# Patient Record
Sex: Male | Born: 1940 | Race: White | Hispanic: No | Smoking: Former smoker
Health system: Southern US, Community
[De-identification: ages and names within clinical notes are randomized; demographics above are authoritative.]

## PROBLEM LIST (undated history)

## (undated) DIAGNOSIS — M1990 Unspecified osteoarthritis, unspecified site: Secondary | ICD-10-CM

## (undated) DIAGNOSIS — I10 Essential (primary) hypertension: Secondary | ICD-10-CM

## (undated) DIAGNOSIS — E785 Hyperlipidemia, unspecified: Secondary | ICD-10-CM

## (undated) HISTORY — PX: EYE SURGERY: SHX253

## (undated) HISTORY — DX: Hyperlipidemia, unspecified: E78.5

---

## 2003-07-20 HISTORY — PX: COLONOSCOPY: SHX174

## 2004-03-26 ENCOUNTER — Encounter: Admission: RE | Admit: 2004-03-26 | Discharge: 2004-03-26 | Payer: Self-pay | Admitting: Family Medicine

## 2012-08-15 ENCOUNTER — Encounter: Payer: Self-pay | Admitting: Gastroenterology

## 2013-03-14 ENCOUNTER — Other Ambulatory Visit: Payer: Self-pay | Admitting: Internal Medicine

## 2013-03-14 DIAGNOSIS — K769 Liver disease, unspecified: Secondary | ICD-10-CM

## 2013-03-21 ENCOUNTER — Other Ambulatory Visit: Payer: Self-pay | Admitting: Internal Medicine

## 2013-03-21 DIAGNOSIS — K769 Liver disease, unspecified: Secondary | ICD-10-CM

## 2013-03-22 ENCOUNTER — Other Ambulatory Visit: Payer: Self-pay

## 2013-03-22 ENCOUNTER — Ambulatory Visit
Admission: RE | Admit: 2013-03-22 | Discharge: 2013-03-22 | Disposition: A | Discharge status: Home / Self Care | Payer: Medicare Other | Source: Ambulatory Visit | Attending: Internal Medicine | Admitting: Internal Medicine

## 2013-03-22 DIAGNOSIS — K769 Liver disease, unspecified: Secondary | ICD-10-CM

## 2013-03-26 ENCOUNTER — Ambulatory Visit
Admission: RE | Admit: 2013-03-26 | Discharge: 2013-03-26 | Disposition: A | Discharge status: Home / Self Care | Payer: Medicare Other | Source: Ambulatory Visit | Attending: Internal Medicine | Admitting: Internal Medicine

## 2013-03-26 DIAGNOSIS — K769 Liver disease, unspecified: Secondary | ICD-10-CM

## 2013-03-26 MED ORDER — GADOBENATE DIMEGLUMINE 529 MG/ML IV SOLN
15.0000 mL | Freq: Once | INTRAVENOUS | Status: AC | PRN
  Administered 2013-03-26: 15 mL via INTRAVENOUS

## 2013-03-27 ENCOUNTER — Other Ambulatory Visit: Payer: Medicare Other

## 2013-05-16 ENCOUNTER — Encounter: Payer: Self-pay | Admitting: Gastroenterology

## 2013-06-13 ENCOUNTER — Encounter: Payer: Medicare Other | Admitting: Gastroenterology

## 2013-08-15 ENCOUNTER — Encounter: Payer: Self-pay | Admitting: Gastroenterology

## 2013-11-21 ENCOUNTER — Ambulatory Visit (AMBULATORY_SURGERY_CENTER): Payer: Medicare Other | Admitting: *Deleted

## 2013-11-21 VITALS — Wt 169.2 lb

## 2013-11-21 DIAGNOSIS — Z1211 Encounter for screening for malignant neoplasm of colon: Secondary | ICD-10-CM

## 2013-11-21 MED ORDER — MOVIPREP 100 G PO SOLR: ORAL | Status: DC

## 2013-11-21 NOTE — Progress Notes (Signed)
Patient denies any allergies to eggs or soy. Patient denies any problems with anesthesia/sedation. Patient denies any oxygen use at home and does not take any diet/weight loss medications. EMMI education assisgned to patient on colonoscopy. 

## 2013-11-26 ENCOUNTER — Encounter: Payer: Self-pay | Admitting: Internal Medicine

## 2013-12-05 ENCOUNTER — Ambulatory Visit (AMBULATORY_SURGERY_CENTER): Payer: Medicare Other | Admitting: Internal Medicine

## 2013-12-05 ENCOUNTER — Encounter: Payer: Self-pay | Admitting: Internal Medicine

## 2013-12-05 VITALS — BP 129/74 | HR 65 | Temp 96.3°F | Resp 14 | Ht 71.0 in | Wt 169.0 lb

## 2013-12-05 DIAGNOSIS — Z1211 Encounter for screening for malignant neoplasm of colon: Secondary | ICD-10-CM

## 2013-12-05 MED ORDER — SODIUM CHLORIDE 0.9 % IV SOLN: 500.0000 mL | INTRAVENOUS | Status: DC

## 2013-12-05 NOTE — Patient Instructions (Signed)
Discharge instructions given with verbal understanding. Handouts on diverticulosis and a high fiber diet. Resume previous medications. YOU HAD AN ENDOSCOPIC PROCEDURE TODAY AT THE [facility]: Refer to the procedure report that was given to you for any specific questions about what was found during the examination.  If the procedure report does not answer your questions, please call your gastroenterologist to clarify.  If you requested that your care partner not be given the details of your procedure findings, then the procedure report has been included in a sealed envelope for you to review at your convenience later.  YOU SHOULD EXPECT: Some feelings of bloating in the abdomen. Passage of more gas than usual.  Walking can help get rid of the air that was put into your GI tract during the procedure and reduce the bloating. If you had a lower endoscopy (such as a colonoscopy or flexible sigmoidoscopy) you may notice spotting of blood in your stool or on the toilet paper. If you underwent a bowel prep for your procedure, then you may not have a normal bowel movement for a few days.  DIET: Your first meal following the procedure should be a light meal and then it is ok to progress to your normal diet.  A half-sandwich or bowl of soup is an example of a good first meal.  Heavy or fried foods are harder to digest and may make you feel nauseous or bloated.  Likewise meals heavy in dairy and vegetables can cause extra gas to form and this can also increase the bloating.  Drink plenty of fluids but you should avoid alcoholic beverages for 24 hours.  ACTIVITY: Your care partner should take you home directly after the procedure.  You should plan to take it easy, moving slowly for the rest of the day.  You can resume normal activity the day after the procedure however you should NOT DRIVE or use heavy machinery for 24 hours (because of the sedation medicines used during the test).    SYMPTOMS TO REPORT  IMMEDIATELY: A gastroenterologist can be reached at any hour.  During normal business hours, 8:30 AM to 5:00 PM Monday through Friday, call [phone].  After hours and on weekends, please call the GI answering service at [phone] who will take a message and have the physician on call contact you.   Following lower endoscopy (colonoscopy or flexible sigmoidoscopy):  Excessive amounts of blood in the stool  Significant tenderness or worsening of abdominal pains  Swelling of the abdomen that is new, acute  Fever of 100F or higher FOLLOW UP: If any biopsies were taken you will be contacted by phone or by letter within the next 1-3 weeks.  Call your gastroenterologist if you have not heard about the biopsies in 3 weeks.  Our staff will call the home number listed on your records the next business day following your procedure to check on you and address any questions or concerns that you may have at that time regarding the information given to you following your procedure. This is a courtesy call and so if there is no answer at the home number and we have not heard from you through the emergency physician on call, we will assume that you have returned to your regular daily activities without incident.  SIGNATURES/CONFIDENTIALITY: You and/or your care partner have signed paperwork which will be entered into your electronic medical record.  These signatures attest to the fact that that the information above on your After   Visit Summary has been reviewed and is understood.  Full responsibility of the confidentiality of this discharge information lies with you and/or your care-partner. 

## 2013-12-05 NOTE — Op Note (Signed)
[facility] [address] [place], [id number]   COLONOSCOPY PROCEDURE REPORT  PATIENT: Manuel [name], Manuel L.  MR#: [id number] BIRTHDATE: [birth year] , 72  yrs. old GENDER: Male ENDOSCOPIST: Hart Carwinora M Sobia Karger, MD REFERRED XB:JYNWGNBY:Daniel Eloise HarmanPaterson, M.D. PROCEDURE DATE:  12/05/2013 PROCEDURE:   Colonoscopy, screening First Screening Colonoscopy - Avg.  risk and is 50 yrs.  old or older - No.  Prior Negative Screening - Now for repeat screening. 10 or more years since last screening  History of Adenoma - Now for follow-up colonoscopy & has been > or = to 3 yrs.  N/A  Polyps Removed Today? No.  Recommend repeat exam, <10 yrs? No. ASA CLASS:   Class II INDICATIONS:Average risk patient for colon cancer and prior colonoscopy March 2004 was a normal exam.  Flexible sigmoidoscopy in 1997 was normal. MEDICATIONS: MAC sedation, administered by CRNA and Propofol (Diprivan) 220 mg IV  DESCRIPTION OF PROCEDURE:   After the risks benefits and alternatives of the procedure were thoroughly explained, informed consent was obtained.  A digital rectal exam revealed no abnormalities of the rectum.   The LB [id number]  endoscope was introduced through the anus and advanced to the cecum, which was identified by both the appendix and ileocecal valve. No adverse events experienced.   The quality of the prep was excellent, using MoviPrep  The instrument was then slowly withdrawn as the colon was fully examined.      COLON FINDINGS: Mild diverticulosis was noted in the sigmoid colon. Retroflexed views revealed no abnormalities. The time to cecum=4 minutes 57 seconds.  Withdrawal time=6 minutes 55 seconds.  The scope was withdrawn and the procedure completed. COMPLICATIONS: There were no complications.  ENDOSCOPIC IMPRESSION: Mild diverticulosis was noted in the sigmoid colon  RECOMMENDATIONS: high fiber diet No recall due to age   eSigned:  Hart Carwinora M Devaeh Amadi, MD 12/05/2013 8:29  AM   cc:   PATIENT NAME:  Manuel [name], Manuel L. MR#: [id number]

## 2013-12-06 ENCOUNTER — Telehealth: Payer: Self-pay | Admitting: *Deleted

## 2013-12-06 NOTE — Telephone Encounter (Signed)
  Follow up Call-  Call back number 12/05/2013  Post procedure Call Back phone  # [phone]  Permission to leave phone message Yes     Patient questions:  Do you have a fever, pain , or abdominal swelling? no Pain Score  0 *  Have you tolerated food without any problems? yes  Have you been able to return to your normal activities? yes  Do you have any questions about your discharge instructions: Diet   no Medications  no Follow up visit  no  Do you have questions or concerns about your Care? no  Actions: * If pain score is 4 or above: No action needed, pain <4.

## 2014-03-11 ENCOUNTER — Encounter: Payer: Self-pay | Admitting: Podiatry

## 2014-03-11 ENCOUNTER — Ambulatory Visit (INDEPENDENT_AMBULATORY_CARE_PROVIDER_SITE_OTHER): Payer: Medicare Other | Admitting: Podiatry

## 2014-03-11 ENCOUNTER — Ambulatory Visit (INDEPENDENT_AMBULATORY_CARE_PROVIDER_SITE_OTHER): Payer: Medicare Other

## 2014-03-11 VITALS — BP 151/80 | HR 71 | Resp 14 | Ht 71.0 in | Wt 160.0 lb

## 2014-03-11 DIAGNOSIS — M722 Plantar fascial fibromatosis: Secondary | ICD-10-CM

## 2014-03-11 DIAGNOSIS — M2020 Hallux rigidus, unspecified foot: Secondary | ICD-10-CM

## 2014-03-11 MED ORDER — TRIAMCINOLONE ACETONIDE 10 MG/ML IJ SUSP: 10.0000 mg | Freq: Once | INTRAMUSCULAR | Status: DC

## 2014-03-11 NOTE — Patient Instructions (Signed)

## 2014-03-11 NOTE — Progress Notes (Signed)
   Subjective:    Patient ID: Manuel [name], male    DOB: 05-31-1941, 73 y.o.   MRN: [id number]  HPI Comments: Pt complains of left medial and lateral heel pain for one month.  Pt states he has used stretching and ice therapy.     Review of Systems  Musculoskeletal: Positive for arthralgias.  All other systems reviewed and are negative.      Objective:   Physical Exam        Assessment & Plan:

## 2014-03-12 ENCOUNTER — Telehealth: Payer: Self-pay | Admitting: *Deleted

## 2014-03-12 NOTE — Telephone Encounter (Signed)
I returned his call and informed him it is okay to do the stretching exercises and the ice.  He said that sounds good, thanks for calling.

## 2014-03-12 NOTE — Progress Notes (Signed)
Subjective:     Patient ID: Manuel [name], male   DOB: [birth year], 73 y.o.   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 Injected the plantar fascial left 3 mg Kenalog 5 mg Xylocaine and discussed open surgery for the big toe joint if symptoms were to persist

## 2014-03-12 NOTE — Telephone Encounter (Signed)
I was in yesterday for Plantar Fasciitis.  He gave me a shot and a brace.  I was just wonder should I continue stretching and ice?  Please give me a call.  I'm scheduled to see him on Monday.

## 2014-03-18 ENCOUNTER — Encounter: Payer: Self-pay | Admitting: Podiatry

## 2014-03-18 ENCOUNTER — Ambulatory Visit (INDEPENDENT_AMBULATORY_CARE_PROVIDER_SITE_OTHER): Payer: Medicare Other | Admitting: Podiatry

## 2014-03-18 DIAGNOSIS — M722 Plantar fascial fibromatosis: Secondary | ICD-10-CM

## 2014-03-18 MED ORDER — TRIAMCINOLONE ACETONIDE 10 MG/ML IJ SUSP: 10.0000 mg | Freq: Once | INTRAMUSCULAR | Status: DC

## 2014-03-19 NOTE — Progress Notes (Signed)
Subjective:     Patient ID: Manuel [name], male   DOB: [birth year], 73 y.o.   MRN: [id number]  HPI patient states doing better but one area that still gets moderately discomforting   Review of Systems     Objective:   Physical Exam Neurovascular status intact with pain plantar aspect left that still is present upon deep palpation    Assessment:     Plantar fasciitis left still present    Plan:     Reinjected the plantar fascia 3 mg Kenalog 5 mg Xylocaine and advised on reduced activity for the next week

## 2014-05-20 ENCOUNTER — Other Ambulatory Visit: Payer: Self-pay | Admitting: Internal Medicine

## 2014-05-20 DIAGNOSIS — R160 Hepatomegaly, not elsewhere classified: Secondary | ICD-10-CM

## 2014-05-26 ENCOUNTER — Ambulatory Visit
Admission: RE | Admit: 2014-05-26 | Discharge: 2014-05-26 | Disposition: A | Discharge status: Home / Self Care | Payer: Medicare Other | Source: Ambulatory Visit | Attending: Internal Medicine | Admitting: Internal Medicine

## 2014-05-26 DIAGNOSIS — R160 Hepatomegaly, not elsewhere classified: Secondary | ICD-10-CM

## 2014-05-26 MED ORDER — GADOBENATE DIMEGLUMINE 529 MG/ML IV SOLN
15.0000 mL | Freq: Once | INTRAVENOUS | Status: AC | PRN
  Administered 2014-05-26: 15 mL via INTRAVENOUS

## 2014-06-04 ENCOUNTER — Other Ambulatory Visit: Payer: Self-pay | Admitting: Internal Medicine

## 2014-06-04 DIAGNOSIS — R51 Headache: Principal | ICD-10-CM

## 2014-06-04 DIAGNOSIS — R519 Headache, unspecified: Secondary | ICD-10-CM

## 2014-06-10 ENCOUNTER — Ambulatory Visit
Admission: RE | Admit: 2014-06-10 | Discharge: 2014-06-10 | Disposition: A | Discharge status: Home / Self Care | Payer: Medicare Other | Source: Ambulatory Visit | Attending: Internal Medicine | Admitting: Internal Medicine

## 2014-06-10 DIAGNOSIS — R519 Headache, unspecified: Secondary | ICD-10-CM

## 2014-06-10 DIAGNOSIS — R51 Headache: Principal | ICD-10-CM

## 2016-03-19 IMAGING — MR MR ABDOMEN WO/W CM
11 of 16 series · 26 of 48 positions shown · IV contrast (multihance)
Comparison: MRI abdomen dated 03/26/2013. CT abdomen pelvis dated
03/07/2013.

CLINICAL DATA: Follow up liver lesion on MRI

EXAM:
MRI ABDOMEN WITHOUT AND WITH CONTRAST
TECHNIQUE: Multiplanar multisequence MR imaging of the abdomen was performed
both before and after the administration of intravenous contrast.
CONTRAST:  15mL MULTIHANCE GADOBENATE DIMEGLUMINE 529 MG/ML IV SOLN

[Series 3: cor haste · coronal · 5.0mm · 0.74mm/px · 1 of 32 slices shown]
[im 1/32]
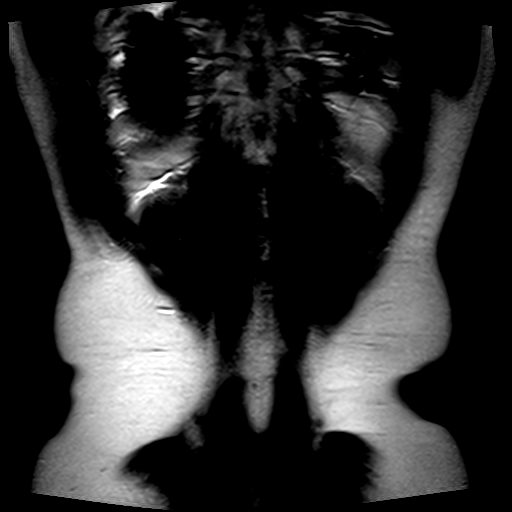

[Series 4: T1 · axial · 6.0mm · 0.74mm/px · z∈[-100,+118]mm · 2 of 68 slices shown]
[im 1/68]
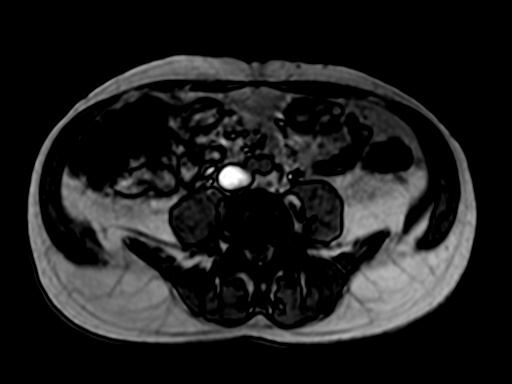
[im 68/68]
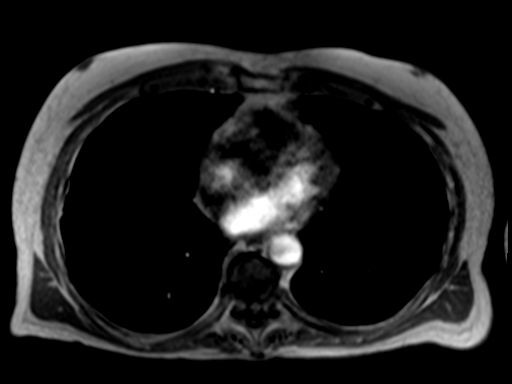

[Series 5: axial haste · axial · 6.0mm · 0.78mm/px · 1 of 34 slices shown]
[im 1/34]
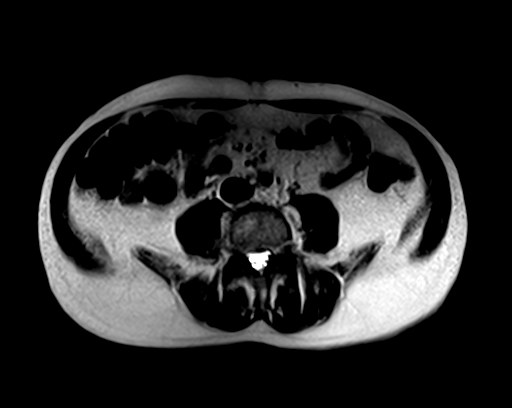

[Series 6: T2 · axial · 6.0mm · 1.04mm/px · 1 of 30 slices shown]
[im 1/30]
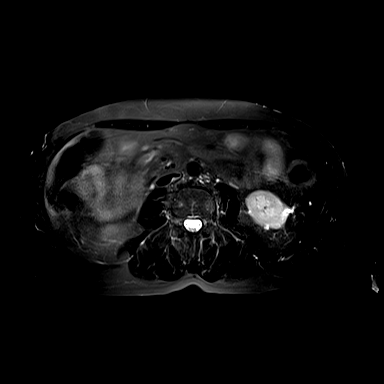

[Series 7: ep2d_diff_b50_500_800_p2_trig · axial · 6.0mm · 2.08mm/px · z∈[-58,+150]mm · 3 of 90 slices shown]
[im 1/90]
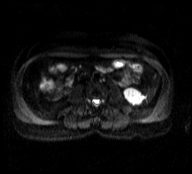
[im 45/90]
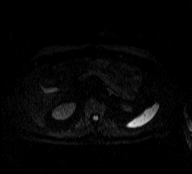
[im 90/90]
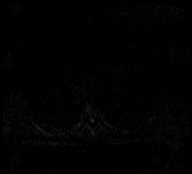

[Series 8: ep2d_diff_b50_500_800_p2_trig_adc · axial · 6.0mm · 2.08mm/px · 1 of 30 slices shown]
[im 1/30]
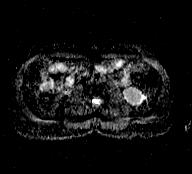

[Series 9: T1 dynamic · axial · non-contrast · 2.5mm · 0.78mm/px · z∈[-102,+116]mm · 4 of 88 slices shown]
[im 1/88]
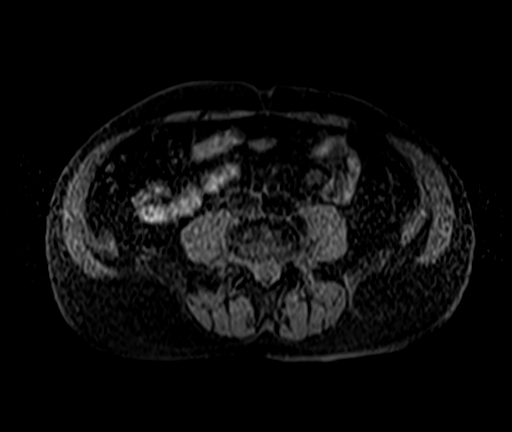
[im 30/88]
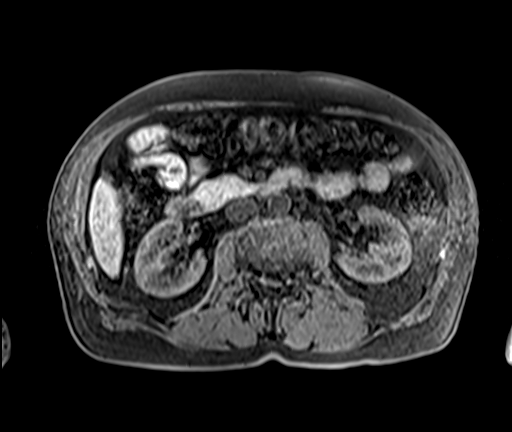
[im 59/88]
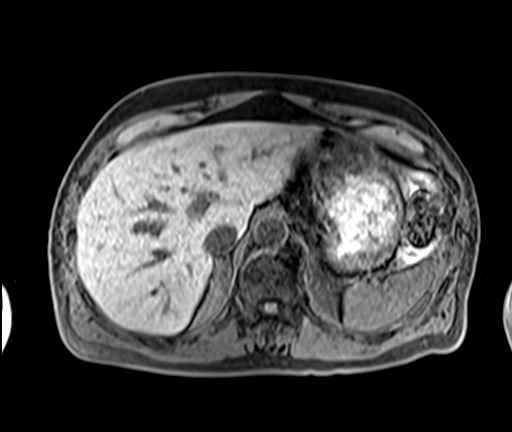
[im 88/88]
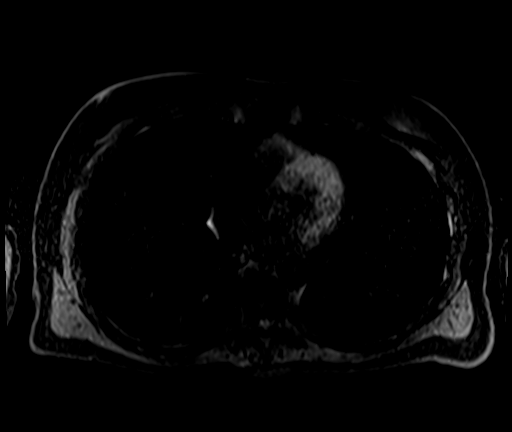

[Series 10: T1 dynamic post-contrast · axial · 2.5mm · 0.78mm/px · z∈[-102,+116]mm · 4 of 88 slices shown (1 of 4)]
[im 1/88]
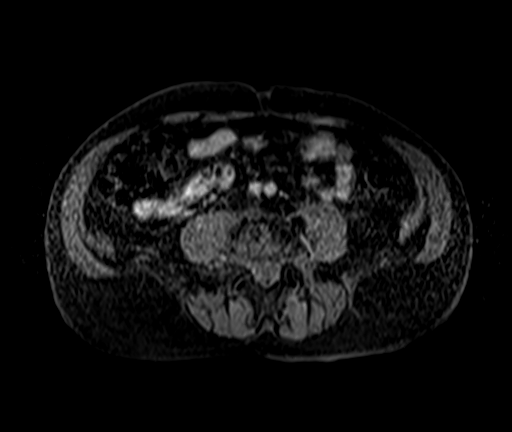
[im 30/88]
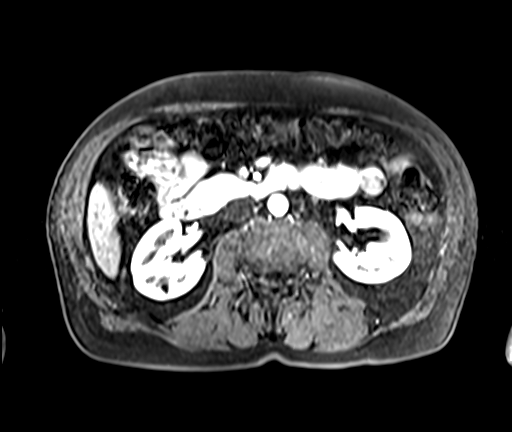
[im 59/88]
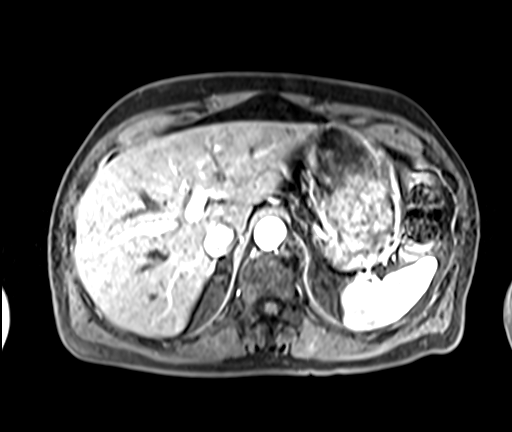
[im 88/88]
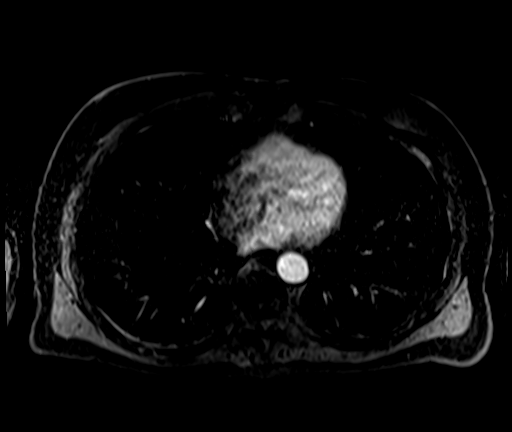

[Series 11: T1 dynamic post-contrast · axial · 2.5mm · 0.78mm/px · z∈[-102,+116]mm · 4 of 88 slices shown (2 of 4)]
[im 1/88]
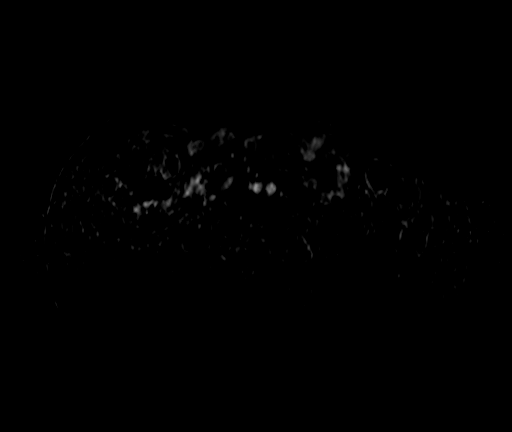
[im 30/88]
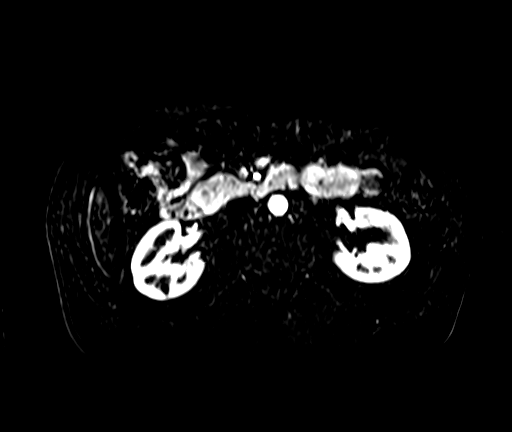
[im 59/88]
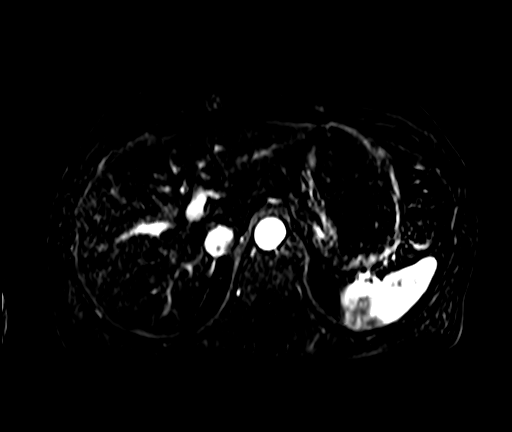
[im 88/88]
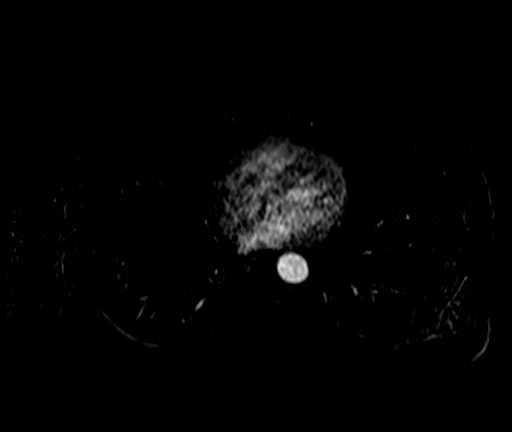

[Series 12: T1 dynamic post-contrast · axial · 2.5mm · 0.78mm/px · z∈[-102,+116]mm · 4 of 88 slices shown (3 of 4)]
[im 1/88]
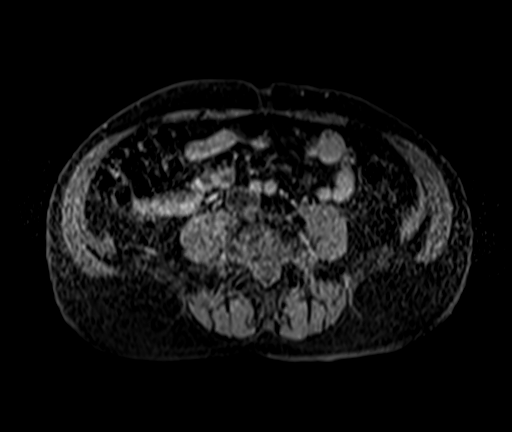
[im 30/88]
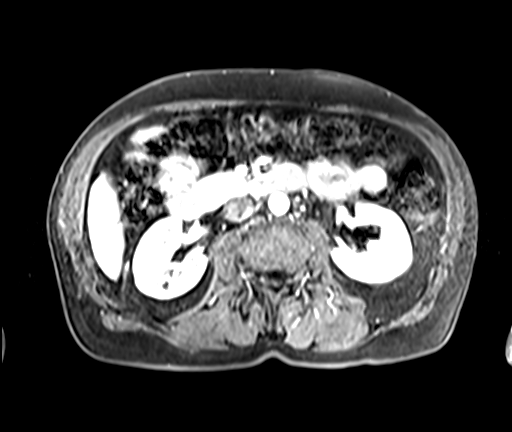
[im 59/88]
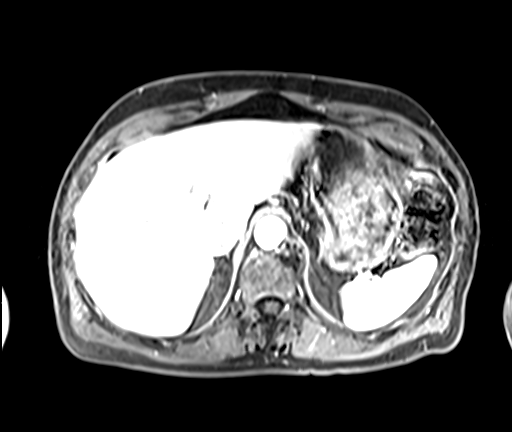
[im 88/88]
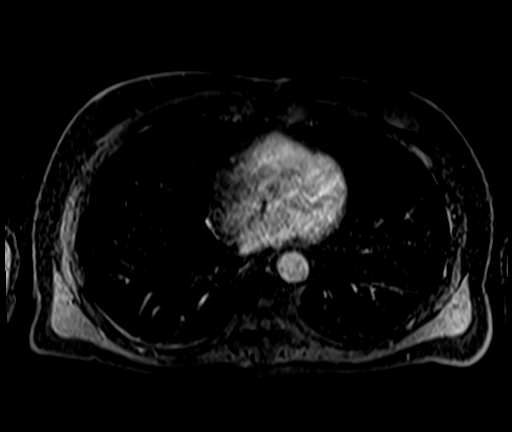

[Series 13: T1 dynamic post-contrast · axial · 2.5mm · 0.78mm/px · 1 of 88 slices shown (4 of 4)]
[im 1/88]
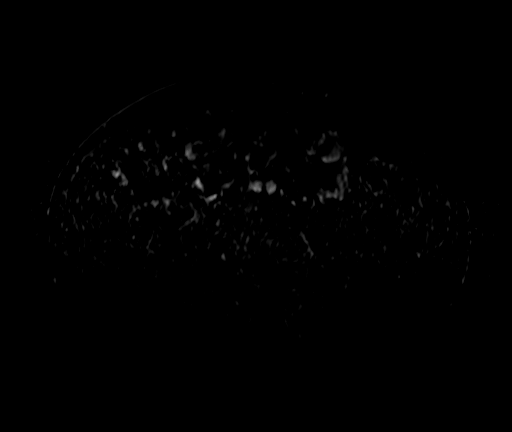

[26 of 48 positions shown; findings below may reference images not displayed]

FINDINGS: Prior irregular cyst along the anterior aspect of the caudate has
essentially resolved, with minimal residual scarring (series
17/image 39).

Additional scattered stable hepatic cysts measuring up to 6 mm
(series 5/image 8).

Gallbladder is unremarkable. No intrahepatic or extrahepatic ductal
dilatation. No suspicious/enhancing hepatic lesions.

Gallbladder is unremarkable. No intrahepatic or extrahepatic ductal
dilatation.

Pancreas is within normal limits.

Spleen and adrenal glands are unremarkable.

9 mm cyst in the posterior interpolar right kidney (series 6/ image
20). Left kidney is within normal limits. No hydronephrosis.

No abdominal ascites.

No suspicious abdominal lymphadenopathy.

No focal osseous lesions.
IMPRESSION: Prior irregular cyst along the anterior aspect of the caudate has
essentially resolved, with minimal residual scarring. No further
follow-up imaging is required.

## 2018-06-30 ENCOUNTER — Encounter: Payer: Self-pay | Admitting: Podiatry

## 2018-06-30 ENCOUNTER — Other Ambulatory Visit: Payer: Self-pay | Admitting: Podiatry

## 2018-06-30 ENCOUNTER — Ambulatory Visit (INDEPENDENT_AMBULATORY_CARE_PROVIDER_SITE_OTHER): Payer: Medicare Other

## 2018-06-30 ENCOUNTER — Ambulatory Visit: Payer: Medicare Other | Admitting: Podiatry

## 2018-06-30 VITALS — BP 179/100 | HR 77

## 2018-06-30 DIAGNOSIS — M79672 Pain in left foot: Secondary | ICD-10-CM

## 2018-06-30 DIAGNOSIS — M779 Enthesopathy, unspecified: Secondary | ICD-10-CM | POA: Diagnosis not present

## 2018-06-30 DIAGNOSIS — M79671 Pain in right foot: Secondary | ICD-10-CM

## 2018-06-30 DIAGNOSIS — M205X9 Other deformities of toe(s) (acquired), unspecified foot: Secondary | ICD-10-CM | POA: Diagnosis not present

## 2018-07-02 NOTE — Progress Notes (Signed)
Subjective:   Patient ID: Manuel [name], male   DOB: 77 y.o.   MRN: [id number]   HPI Patient presents stating he has had chronic pain in his feet and he just wants to get it checked and he feels like he has arthritis of his big toe joint bilateral.  Also has high blood pressure that he keeps monitored and it does tend to go up with a doctor and was up today.  Patient does not smoke and likes to be active   Review of Systems  All other systems reviewed and are negative.       Objective:  Physical Exam Vitals signs and nursing note reviewed.  Constitutional:      Appearance: He is well-developed.  Pulmonary:     Effort: Pulmonary effort is normal.  Musculoskeletal: Normal range of motion.  Skin:    General: Skin is warm.  Neurological:     Mental Status: He is alert.     Neurovascular status intact muscle strength is adequate range of motion was within normal limits except for severe loss of motion first MPJ bilateral with crepitus of the joint bone spur formation and pain developed of a mild nature with palpation.  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  If symptoms get worse we will need to consider surgery and ultimately may require surgery the big toe joint  X-rays indicate there is severe arthritis and bone spur formation around the big toe joint bilateral with moderate discomfort upon palpation

## 2018-07-05 DIAGNOSIS — H6123 Impacted cerumen, bilateral: Secondary | ICD-10-CM | POA: Insufficient documentation

## 2018-08-16 ENCOUNTER — Other Ambulatory Visit: Payer: Self-pay | Admitting: Podiatry

## 2018-08-16 ENCOUNTER — Ambulatory Visit (INDEPENDENT_AMBULATORY_CARE_PROVIDER_SITE_OTHER): Payer: Medicare Other | Admitting: Podiatry

## 2018-08-16 ENCOUNTER — Ambulatory Visit (INDEPENDENT_AMBULATORY_CARE_PROVIDER_SITE_OTHER): Payer: Medicare Other

## 2018-08-16 ENCOUNTER — Encounter: Payer: Self-pay | Admitting: Podiatry

## 2018-08-16 DIAGNOSIS — M779 Enthesopathy, unspecified: Secondary | ICD-10-CM

## 2018-08-16 DIAGNOSIS — M79671 Pain in right foot: Secondary | ICD-10-CM

## 2018-08-16 DIAGNOSIS — M205X9 Other deformities of toe(s) (acquired), unspecified foot: Secondary | ICD-10-CM | POA: Diagnosis not present

## 2018-08-16 DIAGNOSIS — M7751 Other enthesopathy of right foot: Secondary | ICD-10-CM | POA: Diagnosis not present

## 2018-08-16 MED ORDER — TRIAMCINOLONE ACETONIDE 10 MG/ML IJ SUSP
10.0000 mg | Freq: Once | INTRAMUSCULAR | Status: AC
  Administered 2018-08-16: 10 mg

## 2018-08-16 NOTE — Progress Notes (Signed)
Subjective:   Patient ID: Manuel [name], male   DOB: 78 y.o.   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  I explained procedure risk and patient wants the procedure and today I did a proximal nerve block aspirated the second MPJ getting out a small amount of a bloody infiltrate indicating probable trauma to the joint over the holidays and I did careful injection quarter cc Dexasone Kenalog and advised on rigid bottom shoes.  Discussed the hallux limitus again and patient will be seen back to recheck  X-rays indicated severe arthritis first MPJ right with no indications of damage for fracture second MPJ

## 2018-10-30 ENCOUNTER — Telehealth: Payer: Self-pay | Admitting: *Deleted

## 2018-10-30 NOTE — Telephone Encounter (Signed)
Pt emailed, "This is a message for Dr. Charlsie Merles.  You have previosly diagnosed me with arthritis in my feet. I still have pain in th ball of my feet when I walk on hard surfaces and in by ball and toes when I have walked 18 holes of golf. A friend said I should use Blue Emu. What is your opinion of the effectiveness of this and are there any significant side effects."

## 2018-11-01 NOTE — Telephone Encounter (Signed)
Worth giving a try. No downside

## 2018-11-01 NOTE — Telephone Encounter (Signed)
Left message informing informing pt of DR. Regal's orders and that watch for skin irritates that could occur with any topical and not to apply to broken skin and if no improvement make an appt.

## 2019-03-01 ENCOUNTER — Ambulatory Visit: Payer: Self-pay | Admitting: General Surgery

## 2019-03-01 NOTE — H&P (Signed)
Manuel [name]: 02/28/2019 10:29 AM Location: [facility] Patient #: [id number] DOB: 04/28/1941 Widowed / Language: Manuel [name] / Race: White Male  History of Present Illness Manuel [name] Manuel [name]; 03/01/2019 9:35 AM) The patient is a 78 year old male who presents with an inguinal hernia. He is referred by Dr. Sharlett [name] for evaluation of a right inguinal hernia. He states about a week or 2 he started noticing some pain and discomfort and a bulge in his right groin. He states the bulge comes and goes. It is not there in the morning but progressively becomes prominent throughout the day especially at the end of the day. He will tend to have discomfort later in the day. He describes it as a dull ache. He feels it more in the evening. He is quite active. He plays golf several times a week and walks up to 6 miles per day. However the hernia is causing some interference with his walking and coughing due to the discomfort. He does have some BPH issues. He denies any prior abdominal surgery. He denies any chest pain, chest pressure, source of breath, orthopnea, TIAs or amaurosis fugax. He is a remote smoker quit many years ago. No drug or significant alcohol use. No blood thinners. He does live alone   Problem List/Past Medical Manuel [name], [name]; 03/01/2019 9:36 AM) RIGHT INGUINAL HERNIA (K40.90) NOCTURIA ASSOCIATED WITH BENIGN PROSTATIC HYPERPLASIA (N40.1)  Past Surgical History Manuel [name], Manuel [name]; 02/28/2019 10:29 AM) Cataract Surgery Bilateral.  Diagnostic Studies History Manuel [name], Manuel [name]; 02/28/2019 10:29 AM) Colonoscopy 5-10 years ago  Allergies Manuel [name], Manuel [name]; 02/28/2019 10:30 AM) No Known Allergies [02/28/2019]: No Known Drug Allergies [02/28/2019]: Allergies Reconciled  Medication History Manuel [name], [name]; 03/01/2019 9:36 AM) Atorvastatin Calcium (10MG  Tablet, Oral) Active. amLODIPine Besylate (2.5MG  Tablet, Oral) Active. Aspir-Low (81MG   Tablet DR, Oral) Active. Medications Reconciled Tamsulosin HCl (0.4MG  Capsule, 1 (one) Oral daily, Taken starting 02/28/2019) Active.  Social History (Manuel [name], [name]; 02/28/2019 10:29 AM) Alcohol use Occasional alcohol use. Caffeine use Coffee. No drug use Tobacco use Former smoker.  Family History Manuel [name], Manuel [name]; 02/28/2019 10:29 AM) Arthritis Mother. Colon Cancer Mother. Hypertension Father, Mother. Thyroid problems Mother.  Other Problems Manuel [name], [name]; 03/01/2019 9:36 AM) Arthritis High blood pressure Hypercholesterolemia     Review of Systems (Manuel [name] Manuel [name]; 02/28/2019 10:29 AM) General Not Present- Appetite Loss, Chills, Fatigue, Fever, Night Sweats, Weight Gain and Weight Loss. Skin Not Present- Change in Wart/Mole, Dryness, Hives, Jaundice, New Lesions, Non-Healing Wounds, Rash and Ulcer. HEENT Not Present- Earache, Hearing Loss, Hoarseness, Nose Bleed, Oral Ulcers, Ringing in the Ears, Seasonal Allergies, Sinus Pain, Sore Throat, Visual Disturbances, Wears glasses/contact lenses and Yellow Eyes. Respiratory Not Present- Bloody sputum, Chronic Cough, Difficulty Breathing, Snoring and Wheezing. Breast Not Present- Breast Mass, Breast Pain, Nipple Discharge and Skin Changes. Cardiovascular Not Present- Chest Pain, Difficulty Breathing Lying Down, Leg Cramps, Palpitations, Rapid Heart Rate, Shortness of Breath and Swelling of Extremities. Gastrointestinal Present- Abdominal Pain. Not Present- Bloating, Bloody Stool, Change in Bowel Habits, Chronic diarrhea, Constipation, Difficulty Swallowing, Excessive gas, Gets full quickly at meals, Hemorrhoids, Indigestion, Nausea, Rectal Pain and Vomiting. Male Genitourinary Not Present- Blood in Urine, Change in Urinary Stream, Frequency, Impotence, Nocturia, Painful Urination, Urgency and Urine Leakage. Musculoskeletal Not Present- Back Pain, Joint Pain, Joint Stiffness, Muscle Pain, Muscle Weakness and  Swelling of Extremities. Neurological Not Present- Decreased Memory, Fainting, Headaches, Numbness, Seizures, Tingling, Tremor, Trouble walking and Weakness. Psychiatric Not Present- Anxiety, Bipolar,  Change in Sleep Pattern, Depression, Fearful and Frequent crying. Endocrine Not Present- Cold Intolerance, Excessive Hunger, Hair Changes, Heat Intolerance, Hot flashes and New Diabetes. Hematology Not Present- Blood Thinners, Easy Bruising, Excessive bleeding, Gland problems, HIV and Persistent Infections.  Vitals (Manuel [name] Manuel [name]; 02/28/2019 10:31 AM) 02/28/2019 10:30 AM Weight: 175.2 lb Height: 70in Body Surface Area: 1.97 m Body Mass Index: 25.14 kg/m  Temp.: 97.70F(Temporal)  Pulse: 98 (Regular)  P.OX: 99% (Room air) BP: 148/86 (Sitting, Left Arm, Standard)        Physical Exam Manuel Areola(Manuel [name] M. Manuel [name] [name]; 03/01/2019 9:35 AM)  General Mental Status-Alert. General Appearance-Consistent with stated age. Hydration-Well hydrated. Voice-Normal.  Head and Neck Head-normocephalic, atraumatic with no lesions or palpable masses. Trachea-midline. Thyroid Gland Characteristics - normal size and consistency.  Eye Eyeball - Bilateral-Extraocular movements intact. Sclera/Conjunctiva - Bilateral-No scleral icterus.  ENMT Ears Pinna - Bilateral - no bony growth in lateral aspect of ear canal, no drainage observed. Nose and Sinuses External Inspection of the Nose - symmetric, no deformities observed.  Chest and Lung Exam Chest and lung exam reveals -quiet, even and easy respiratory effort with no use of accessory muscles and on auscultation, normal breath sounds, no adventitious sounds and normal vocal resonance. Inspection Chest Wall - Normal. Back - normal.  Breast - Did not examine.  Cardiovascular Cardiovascular examination reveals -normal heart sounds, regular rate and rhythm with no murmurs and normal pedal pulses  bilaterally.  Abdomen Inspection Inspection of the abdomen reveals - No Hernias. Skin - Scar - no surgical scars. Palpation/Percussion Palpation and Percussion of the abdomen reveal - Soft, Non Tender, No Rebound tenderness, No Rigidity (guarding) and No hepatosplenomegaly. Auscultation Auscultation of the abdomen reveals - Bowel sounds normal.  Male Genitourinary Note: Obvious bulge in right groin. Reducible. Mild tenderness with deep palpation. Both testicles descended. No bulge with Valsalva in left groin  Peripheral Vascular Upper Extremity Palpation - Pulses bilaterally normal.  Neurologic Neurologic evaluation reveals -alert and oriented x 3 with no impairment of recent or remote memory. Mental Status-Normal.  Neuropsychiatric The patient's mood and affect are described as -normal. Judgment and Insight-insight is appropriate concerning matters relevant to self.  Musculoskeletal Normal Exam - Left-Upper Extremity Strength Normal and Lower Extremity Strength Normal. Normal Exam - Right-Upper Extremity Strength Normal and Lower Extremity Strength Normal.  Lymphatic Head & Neck  General Head & Neck Lymphatics: Bilateral - Description - Normal. Axillary - Did not examine. Femoral & Inguinal - Did not examine.    Assessment & Plan Manuel Areola(Kamylah Manzo M. Ajene Carchi [name]; 03/01/2019 9:36 AM)  RIGHT INGUINAL HERNIA (K40.90) Impression: We discussed the etiology of inguinal hernias. We discussed the signs & symptoms of incarceration & strangulation. We discussed non-operative and operative management.  The patient has elected to proceed with laparoscopic repair of right inguinal hernia with mesh   I described the procedure in detail. The patient was given educational material. We discussed the risks and benefits including but not limited to bleeding, infection, chronic inguinal pain, nerve entrapment, hernia recurrence, mesh complications, hematoma formation, urinary retention,  injury to the testicle, numbness in the groin, blood clots, injury to the surrounding structures, and anesthesia risk. We also discussed the typical post operative recovery course, including no heavy lifting for 4-6 weeks. I explained that the likelihood of improvement of their symptoms is good  Current Plans You are being scheduled for surgery- Our schedulers will call you.  You should hear from our office's scheduling department within 5 working days about the  location, date, and time of surgery. We try to make accommodations for patient's preferences in scheduling surgery, but sometimes the OR schedule or the surgeon's schedule prevents us from making those accommodations.  If you have not heard from our office [phone]) in 5 working days, call the office and ask for your surgeon's nurse.  If you have other questions about your diagnosis, plan, or surgery, call the office and ask for your surgeon's nurse.  Pt Education - Pamphlet Given - Laparoscopic Hernia Repair: discussed with patient and provided information. Started Tamsulosin HCl 0.4 MG Oral Capsule, 1 (one) Capsule daily, #30, 02/28/2019, No Refill.  NOCTURIA ASSOCIATED WITH BENIGN PROSTATIC HYPERPLASIA (N40.1) Impression: Since he has some BPH issues I recommended placing him on perioperative Flomax to try to decrease his risk of postoperative urinary retention  Mary SellaEric M. Andrey CampanileWilson, [name], FACS General, Bariatric, & Minimally Invasive Surgery [facility], [place]

## 2019-03-27 NOTE — Patient Instructions (Addendum)
DUE TO COVID-19 ONLY ONE VISITOR IS ALLOWED TO COME WITH YOU AND STAY IN THE WAITING ROOM ONLY DURING PRE OP AND PROCEDURE DAY OF SURGERY.  THE 1 VISITOR MAY VISIT WITH YOU AFTER SURGERY IN YOUR PRIVATE ROOM DURING VISITING HOURS ONLY!  YOU NEED TO HAVE A COVID 19 TEST ON__Fri 9/11_____ @__10 :45_____, THIS TEST MUST BE DONE BEFORE SURGERY, COME  [address], [place] , [address].  ([place])  ONCE YOUR COVID TEST IS COMPLETED, PLEASE BEGIN THE QUARANTINE INSTRUCTIONS AS OUTLINED IN YOUR HANDOUT.                Manuel [name]   Your procedure is scheduled on: Tuesday 04/03/19   Report to [facility] Main  Entrance   Report to admitting at  10:11 AM     Call this number if you have problems the morning of surgery [phone]    Remember: Do not eat food or drink liquids :After Midnight.  BRUSH YOUR TEETH MORNING OF SURGERY AND RINSE YOUR MOUTH OUT, NO CHEWING GUM CANDY OR MINTS.     Take these medicines the morning of surgery with A SIP OF WATER: Amlodipine, Tamsulosin                                 You may not have any metal on your body including piercings             Do not wear jewelry, lotions, powders or  deodorant                 Men may shave face and neck.   Do not bring valuables to the hospital. [place].  Contacts, dentures or bridgework may not be worn into surgery.       Patients discharged the day of surgery will not be allowed to drive home.  IF YOU ARE HAVING SURGERY AND GOING HOME THE SAME DAY, YOU MUST HAVE AN ADULT TO DRIVE YOU HOME AND BE WITH YOU FOR 24 HOURS.  YOU MAY GO HOME BY TAXI OR UBER OR ORTHERWISE, BUT AN ADULT MUST ACCOMPANY YOU HOME AND STAY WITH YOU FOR 24 HOURS.  Name and phone number of your driver:  Special Instructions: N/A              Please read over the following fact sheets you were  given: _____________________________________________________________________             [facility] - Preparing for Surgery Before surgery, you can play an important role .  Because skin is not sterile, your skin needs to be as free of germs as possible.   You can reduce the number of germs on your skin by washing with CHG (chlorahexidine gluconate) soap before surgery.   CHG is an antiseptic cleaner which kills germs and bonds with the skin to continue killing germs even after washing. Please DO NOT use if you have an allergy to CHG or antibacterial soaps.   If your skin becomes reddened/irritated stop using the CHG and inform your nurse when you arrive at Short Stay. .  You may shave your face/neck.  Please follow these instructions carefully:  1.  Shower with CHG Soap the night before surgery and the  morning of Surgery.  2.  If you choose  to wash your hair, wash your hair first as usual with your  normal  shampoo.  3.  After you shampoo, rinse your hair and body thoroughly to remove the  shampoo.                                        4.  Use CHG as you would any other liquid soap.  You can apply chg directly  to the skin and wash                       Gently with a scrungie or clean washcloth.  5.  Apply the CHG Soap to your body ONLY FROM THE NECK DOWN.   Do not use on face/ open                           Wound or open sores. Avoid contact with eyes, ears mouth and genitals (private parts).                       Wash face,  Genitals (private parts) with your normal soap.             6.  Wash thoroughly, paying special attention to the area where your surgery  will be performed.  7.  Thoroughly rinse your body with warm water from the neck down.  8.  DO NOT shower/wash with your normal soap after using and rinsing off  the CHG Soap.                9.  Pat yourself dry with a clean towel.            10.  Wear clean pajamas.            11.  Place clean sheets on your bed the night of  your first shower and do not  sleep with pets. Day of Surgery : Do not apply any lotions/deodorants the morning of surgery.  Please wear clean clothes to the hospital/surgery center.  FAILURE TO FOLLOW THESE INSTRUCTIONS MAY RESULT IN THE CANCELLATION OF YOUR SURGERY PATIENT SIGNATURE_________________________________  NURSE SIGNATURE__________________________________  ________________________________________________________________________

## 2019-03-28 ENCOUNTER — Encounter (HOSPITAL_COMMUNITY): Payer: Self-pay

## 2019-03-28 ENCOUNTER — Encounter (HOSPITAL_COMMUNITY)
Admission: RE | Admit: 2019-03-28 | Discharge: 2019-03-28 | Disposition: A | Discharge status: Home / Self Care | Payer: Medicare Other | Source: Ambulatory Visit | Attending: General Surgery | Admitting: General Surgery

## 2019-03-28 ENCOUNTER — Other Ambulatory Visit: Payer: Self-pay

## 2019-03-28 DIAGNOSIS — Z01810 Encounter for preprocedural cardiovascular examination: Secondary | ICD-10-CM | POA: Insufficient documentation

## 2019-03-28 DIAGNOSIS — Z01812 Encounter for preprocedural laboratory examination: Secondary | ICD-10-CM | POA: Insufficient documentation

## 2019-03-28 DIAGNOSIS — Z20828 Contact with and (suspected) exposure to other viral communicable diseases: Secondary | ICD-10-CM | POA: Insufficient documentation

## 2019-03-28 HISTORY — DX: Unspecified osteoarthritis, unspecified site: M19.90

## 2019-03-28 HISTORY — DX: Essential (primary) hypertension: I10

## 2019-03-28 LAB — BASIC METABOLIC PANEL
Anion gap: 5 (ref 5–15)
BUN: 13 mg/dL (ref 8–23)
CO2: 28 mmol/L (ref 22–32)
Calcium: 9.1 mg/dL (ref 8.9–10.3)
Chloride: 105 mmol/L (ref 98–111)
Creatinine, Ser: 0.74 mg/dL (ref 0.61–1.24)
GFR calc Af Amer: >60 mL/min (ref >60)
GFR calc non Af Amer: >60 mL/min (ref >60)
Glucose, Bld: 91 mg/dL (ref 70–99)
Potassium: 4.3 mmol/L (ref 3.5–5.1)
Sodium: 138 mmol/L (ref 135–145)

## 2019-03-28 LAB — CBC
HCT: 42.9 % (ref 39.0–52.0)
Hemoglobin: 14 g/dL (ref 13.0–17.0)
MCH: 31 pg (ref 26.0–34.0)
MCHC: 32.6 g/dL (ref 30.0–36.0)
MCV: 95.1 fL (ref 80.0–100.0)
Platelets: 171 10*3/uL (ref 150–400)
RBC: 4.51 MIL/uL (ref 4.22–5.81)
RDW: 13.2 % (ref 11.5–15.5)
WBC: 6.2 10*3/uL (ref 4.0–10.5)
nRBC: 0 % (ref 0.0–0.2)

## 2019-03-28 NOTE — Progress Notes (Signed)
PCP -  Dr. Sharlett Iles Cardiologist - no  Chest x-ray - no EKG - 03/28/19 Stress Test - no ECHO - no Cardiac Cath - no  Sleep Study - no CPAP -   Fasting Blood Sugar - NA Checks Blood Sugar _____ times a day  Blood Thinner Instructions:ASA no instructions given by MD.  Aspirin Instructions:stop 5 days prior to surgery PAT RN Last Dose:03/28/19  Anesthesia review:   Patient denies shortness of breath, fever, cough and chest pain at PAT appointment yes  Patient verbalized understanding of instructions that were given to them at the PAT appointment. Patient was also instructed that they will need to review over the PAT instructions again at home before surgery. yes

## 2019-03-30 ENCOUNTER — Other Ambulatory Visit (HOSPITAL_COMMUNITY)
Admission: RE | Admit: 2019-03-30 | Discharge: 2019-03-30 | Disposition: A | Discharge status: Home / Self Care | Payer: Medicare Other | Source: Ambulatory Visit | Attending: General Surgery | Admitting: General Surgery

## 2019-03-30 DIAGNOSIS — Z01810 Encounter for preprocedural cardiovascular examination: Secondary | ICD-10-CM | POA: Diagnosis not present

## 2019-03-31 LAB — NOVEL CORONAVIRUS, NAA (HOSP ORDER, SEND-OUT TO REF LAB; TAT 18-24 HRS): SARS-CoV-2, NAA: NOT DETECTED

## 2019-04-03 ENCOUNTER — Ambulatory Visit (HOSPITAL_COMMUNITY)
Admission: RE | Admit: 2019-04-03 | Discharge: 2019-04-03 | Disposition: A | Discharge status: Home / Self Care | Payer: Medicare Other | Source: Ambulatory Visit | Attending: General Surgery | Admitting: General Surgery

## 2019-04-03 ENCOUNTER — Encounter (HOSPITAL_COMMUNITY)
Admission: RE | Disposition: A | Discharge status: Home / Self Care | Payer: Self-pay | Source: Ambulatory Visit | Attending: General Surgery

## 2019-04-03 ENCOUNTER — Ambulatory Visit (HOSPITAL_COMMUNITY): Payer: Medicare Other | Admitting: Physician Assistant

## 2019-04-03 ENCOUNTER — Other Ambulatory Visit: Payer: Self-pay

## 2019-04-03 ENCOUNTER — Encounter (HOSPITAL_COMMUNITY): Payer: Self-pay | Admitting: Certified Registered Nurse Anesthetist

## 2019-04-03 ENCOUNTER — Ambulatory Visit (HOSPITAL_COMMUNITY): Payer: Medicare Other | Admitting: Anesthesiology

## 2019-04-03 DIAGNOSIS — I10 Essential (primary) hypertension: Secondary | ICD-10-CM | POA: Insufficient documentation

## 2019-04-03 DIAGNOSIS — E785 Hyperlipidemia, unspecified: Secondary | ICD-10-CM | POA: Diagnosis not present

## 2019-04-03 DIAGNOSIS — Z87891 Personal history of nicotine dependence: Secondary | ICD-10-CM | POA: Insufficient documentation

## 2019-04-03 DIAGNOSIS — Z79899 Other long term (current) drug therapy: Secondary | ICD-10-CM | POA: Diagnosis not present

## 2019-04-03 DIAGNOSIS — M19072 Primary osteoarthritis, left ankle and foot: Secondary | ICD-10-CM | POA: Insufficient documentation

## 2019-04-03 DIAGNOSIS — K4090 Unilateral inguinal hernia, without obstruction or gangrene, not specified as recurrent: Secondary | ICD-10-CM | POA: Insufficient documentation

## 2019-04-03 DIAGNOSIS — Z7982 Long term (current) use of aspirin: Secondary | ICD-10-CM | POA: Insufficient documentation

## 2019-04-03 DIAGNOSIS — M19071 Primary osteoarthritis, right ankle and foot: Secondary | ICD-10-CM | POA: Insufficient documentation

## 2019-04-03 DIAGNOSIS — N400 Enlarged prostate without lower urinary tract symptoms: Secondary | ICD-10-CM | POA: Diagnosis not present

## 2019-04-03 HISTORY — PX: INGUINAL HERNIA REPAIR: SHX194

## 2019-04-03 SURGERY — REPAIR, HERNIA, INGUINAL, LAPAROSCOPIC
Anesthesia: General | Site: Abdomen | Laterality: Right

## 2019-04-03 MED ORDER — CHLORHEXIDINE GLUCONATE 4 % EX LIQD: 60.0000 mL | Freq: Once | CUTANEOUS | Status: DC

## 2019-04-03 MED ORDER — SUGAMMADEX SODIUM 200 MG/2ML IV SOLN
INTRAVENOUS | Status: DC | PRN
  Administered 2019-04-03: 200 mg via INTRAVENOUS

## 2019-04-03 MED ORDER — FENTANYL CITRATE (PF) 250 MCG/5ML IJ SOLN
INTRAMUSCULAR | Status: AC
  Filled 2019-04-03: qty 5

## 2019-04-03 MED ORDER — ROCURONIUM BROMIDE 10 MG/ML (PF) SYRINGE
PREFILLED_SYRINGE | INTRAVENOUS | Status: DC | PRN
  Administered 2019-04-03: 20 mg via INTRAVENOUS
  Administered 2019-04-03: 50 mg via INTRAVENOUS

## 2019-04-03 MED ORDER — FENTANYL CITRATE (PF) 250 MCG/5ML IJ SOLN
INTRAMUSCULAR | Status: DC | PRN
  Administered 2019-04-03: 50 ug via INTRAVENOUS
  Administered 2019-04-03: 100 ug via INTRAVENOUS

## 2019-04-03 MED ORDER — SODIUM CHLORIDE 0.9 % IV SOLN
INTRAVENOUS | Status: DC | PRN
  Administered 2019-04-03: 10 ug/min via INTRAVENOUS

## 2019-04-03 MED ORDER — BUPIVACAINE-EPINEPHRINE 0.25% -1:200000 IJ SOLN: INTRAMUSCULAR | Status: DC | PRN

## 2019-04-03 MED ORDER — LACTATED RINGERS IV SOLN
INTRAVENOUS | Status: DC
  Administered 2019-04-03: 12:00:00 via INTRAVENOUS

## 2019-04-03 MED ORDER — PROPOFOL 10 MG/ML IV BOLUS
INTRAVENOUS | Status: AC
  Filled 2019-04-03: qty 20

## 2019-04-03 MED ORDER — OXYCODONE HCL 5 MG PO TABS: 5.0000 mg | ORAL_TABLET | Freq: Four times a day (QID) | ORAL | 0 refills | Status: DC | PRN

## 2019-04-03 MED ORDER — CEFAZOLIN SODIUM-DEXTROSE 2-4 GM/100ML-% IV SOLN
2.0000 g | INTRAVENOUS | Status: AC
  Administered 2019-04-03: 12:00:00 2 g via INTRAVENOUS
  Filled 2019-04-03: qty 100

## 2019-04-03 MED ORDER — EPHEDRINE SULFATE-NACL 50-0.9 MG/10ML-% IV SOSY
PREFILLED_SYRINGE | INTRAVENOUS | Status: DC | PRN
  Administered 2019-04-03: 10 mg via INTRAVENOUS

## 2019-04-03 MED ORDER — PHENYLEPHRINE HCL (PRESSORS) 10 MG/ML IV SOLN
INTRAVENOUS | Status: AC
  Filled 2019-04-03: qty 1

## 2019-04-03 MED ORDER — FENTANYL CITRATE (PF) 100 MCG/2ML IJ SOLN
INTRAMUSCULAR | Status: AC
  Filled 2019-04-03: qty 2

## 2019-04-03 MED ORDER — SUCCINYLCHOLINE CHLORIDE 200 MG/10ML IV SOSY
PREFILLED_SYRINGE | INTRAVENOUS | Status: DC | PRN
  Administered 2019-04-03: 100 mg via INTRAVENOUS

## 2019-04-03 MED ORDER — DEXAMETHASONE SODIUM PHOSPHATE 10 MG/ML IJ SOLN
INTRAMUSCULAR | Status: DC | PRN
  Administered 2019-04-03: 10 mg via INTRAVENOUS

## 2019-04-03 MED ORDER — 0.9 % SODIUM CHLORIDE (POUR BTL) OPTIME
TOPICAL | Status: DC | PRN
  Administered 2019-04-03: 12:00:00 1000 mL

## 2019-04-03 MED ORDER — GABAPENTIN 300 MG PO CAPS
300.0000 mg | ORAL_CAPSULE | ORAL | Status: AC
  Administered 2019-04-03: 300 mg via ORAL
  Filled 2019-04-03: qty 1

## 2019-04-03 MED ORDER — FENTANYL CITRATE (PF) 100 MCG/2ML IJ SOLN
25.0000 ug | INTRAMUSCULAR | Status: DC | PRN
  Administered 2019-04-03: 50 ug via INTRAVENOUS

## 2019-04-03 MED ORDER — ONDANSETRON HCL 4 MG/2ML IJ SOLN
INTRAMUSCULAR | Status: AC
  Filled 2019-04-03: qty 2

## 2019-04-03 MED ORDER — BUPIVACAINE HCL (PF) 0.25 % IJ SOLN
INTRAMUSCULAR | Status: DC | PRN
  Administered 2019-04-03: 30 mL

## 2019-04-03 MED ORDER — BUPIVACAINE HCL (PF) 0.25 % IJ SOLN
INTRAMUSCULAR | Status: AC
  Filled 2019-04-03: qty 30

## 2019-04-03 MED ORDER — PROPOFOL 10 MG/ML IV BOLUS
INTRAVENOUS | Status: DC | PRN
  Administered 2019-04-03: 150 mg via INTRAVENOUS

## 2019-04-03 MED ORDER — ACETAMINOPHEN 500 MG PO TABS
1000.0000 mg | ORAL_TABLET | ORAL | Status: AC
  Administered 2019-04-03: 1000 mg via ORAL
  Filled 2019-04-03: qty 2

## 2019-04-03 MED ORDER — DEXAMETHASONE SODIUM PHOSPHATE 10 MG/ML IJ SOLN
INTRAMUSCULAR | Status: AC
  Filled 2019-04-03: qty 1

## 2019-04-03 MED ORDER — BUPIVACAINE LIPOSOME 1.3 % IJ SUSP
20.0000 mL | Freq: Once | INTRAMUSCULAR | Status: AC
  Administered 2019-04-03: 20 mL
  Filled 2019-04-03: qty 20

## 2019-04-03 MED ORDER — LIDOCAINE 2% (20 MG/ML) 5 ML SYRINGE
INTRAMUSCULAR | Status: DC | PRN
  Administered 2019-04-03: 100 mg via INTRAVENOUS

## 2019-04-03 MED ORDER — ONDANSETRON HCL 4 MG/2ML IJ SOLN
INTRAMUSCULAR | Status: DC | PRN
  Administered 2019-04-03: 4 mg via INTRAVENOUS

## 2019-04-03 SURGICAL SUPPLY — 45 items
APPLIER CLIP 5 13 M/L LIGAMAX5 (MISCELLANEOUS)
CABLE HIGH FREQUENCY MONO STRZ (ELECTRODE) ×2 IMPLANT
CHLORAPREP W/TINT 26 (MISCELLANEOUS) ×2 IMPLANT
CLIP APPLIE 5 13 M/L LIGAMAX5 (MISCELLANEOUS) IMPLANT
COVER SURGICAL LIGHT HANDLE (MISCELLANEOUS) ×2 IMPLANT
COVER WAND RF STERILE (DRAPES) IMPLANT
DECANTER SPIKE VIAL GLASS SM (MISCELLANEOUS) ×2 IMPLANT
DERMABOND ADVANCED (GAUZE/BANDAGES/DRESSINGS) ×1
DERMABOND ADVANCED .7 DNX12 (GAUZE/BANDAGES/DRESSINGS) ×1 IMPLANT
DEVICE SECURE STRAP 25 ABSORB (INSTRUMENTS) ×1 IMPLANT
ELECT PENCIL ROCKER SW 15FT (MISCELLANEOUS) IMPLANT
ELECT REM PT RETURN 15FT ADLT (MISCELLANEOUS) ×2 IMPLANT
GLOVE BIO SURGEON STRL SZ7.5 (GLOVE) ×2 IMPLANT
GLOVE BIOGEL PI IND STRL 6.5 (GLOVE) IMPLANT
GLOVE BIOGEL PI IND STRL 7.0 (GLOVE) IMPLANT
GLOVE BIOGEL PI IND STRL 7.5 (GLOVE) IMPLANT
GLOVE BIOGEL PI INDICATOR 6.5 (GLOVE) ×1
GLOVE BIOGEL PI INDICATOR 7.0 (GLOVE) ×2
GLOVE BIOGEL PI INDICATOR 7.5 (GLOVE) ×2
GLOVE ECLIPSE 6.5 STRL STRAW (GLOVE) ×1 IMPLANT
GLOVE INDICATOR 8.0 STRL GRN (GLOVE) ×2 IMPLANT
GLOVE SURG SS PI 7.0 STRL IVOR (GLOVE) ×1 IMPLANT
GOWN STRL REUS W/ TWL LRG LVL3 (GOWN DISPOSABLE) IMPLANT
GOWN STRL REUS W/TWL LRG LVL3 (GOWN DISPOSABLE) ×1
GOWN STRL REUS W/TWL XL LVL3 (GOWN DISPOSABLE) ×6 IMPLANT
KIT BASIN OR (CUSTOM PROCEDURE TRAY) ×2 IMPLANT
KIT TURNOVER KIT A (KITS) IMPLANT
L-HOOK LAP DISP 36CM (ELECTROSURGICAL)
LHOOK LAP DISP 36CM (ELECTROSURGICAL) IMPLANT
MESH 3DMAX 4X6 RT LRG (Mesh General) ×2 IMPLANT
PAD POSITIONING PINK XL (MISCELLANEOUS) ×1 IMPLANT
PENCIL SMOKE EVACUATOR (MISCELLANEOUS) IMPLANT
SCISSORS LAP 5X35 DISP (ENDOMECHANICALS) ×2 IMPLANT
SET IRRIG TUBING LAPAROSCOPIC (IRRIGATION / IRRIGATOR) ×1 IMPLANT
SET TUBE SMOKE EVAC HIGH FLOW (TUBING) ×1 IMPLANT
SLEEVE XCEL OPT CAN 5 100 (ENDOMECHANICALS) ×2 IMPLANT
SUT MNCRL AB 4-0 PS2 18 (SUTURE) ×2 IMPLANT
SUT NOVA NAB DX-16 0-1 5-0 T12 (SUTURE) IMPLANT
SUT NOVA NAB GS-21 0 18 T12 DT (SUTURE) IMPLANT
SUT VICRYL 0 UR6 27IN ABS (SUTURE) ×1 IMPLANT
TOWEL OR 17X26 10 PK STRL BLUE (TOWEL DISPOSABLE) ×2 IMPLANT
TOWEL OR NON WOVEN STRL DISP B (DISPOSABLE) ×2 IMPLANT
TRAY LAPAROSCOPIC (CUSTOM PROCEDURE TRAY) ×2 IMPLANT
TROCAR BLADELESS OPT 5 100 (ENDOMECHANICALS) ×2 IMPLANT
TROCAR XCEL BLUNT TIP 100MML (ENDOMECHANICALS) ×2 IMPLANT

## 2019-04-03 NOTE — Interval H&P Note (Signed)
History and Physical Interval Note:  04/03/2019 11:33 AM  Manuel [name]  has presented today for surgery, with the diagnosis of right inguinal hernia.  The various methods of treatment have been discussed with the patient and family. After consideration of risks, benefits and other options for treatment, the patient has consented to  Procedure(s): LAPAROSCOPIC RIGHT INGUINAL HERNIA WITH MESH (Right) as a surgical intervention.  The patient's history has been reviewed, patient examined, no change in status, stable for surgery.  I have reviewed the patient's chart and labs.  Questions were answered to the patient's satisfaction.     Greer Pickerel

## 2019-04-03 NOTE — Op Note (Signed)
04/03/2019  Manuel [name] October 19, 1940   PREOPERATIVE DIAGNOSIS: right inguinal hernia.   POSTOPERATIVE DIAGNOSIS: right indirect inguinal hernia.   PROCEDURE: Laparoscopic repair of right indirect inguinal hernia with  mesh (TAPP).   SURGEON: Leighton Ruff. Redmond Pulling, MD   ASSISTANT SURGEON: None.   ANESTHESIA: General plus local consisting of 0.25% Marcaine with epi.   ESTIMATED BLOOD LOSS: Minimal.   FINDINGS: The patient had a indirect hernia on the right. No contralateral hernia.  It was repaired using a large piece of Bard 3DMax mesh  LOCAL:  Marcaine/exparel mix  SPECIMEN: cord lipoma - discarded  INDICATIONS FOR PROCEDURE: 78 yo male with symptomatic inguinal hernia who desired repair. The risks and benefits including but not limited to bleeding, infection, chronic inguinal pain, nerve entrapment, hernia recurrence, mesh complications, hematoma formation, urinary retention, injury to the testicles or the ovaries, numbness in the groin, blood clots, injury to the surrounding structures, and anesthesia risk was discussed with the patient.  DESCRIPTION OF PROCEDURE: After obtaining verbal consent and marking  the right groin in the holding area with the patient confirming the  operative site, the patient was then taken back to the operating room, placed  supine on the operating room table. General endotracheal anesthesia was  established. The patient had emptied their bladder prior to going back to  the operating room. Sequential compression devices were placed. The  abdomen and groin were prepped and draped in the usual standard surgical  fashion with ChloraPrep. The patient received oral Tylenol as well as IV  antibiotics prior to the incision. A surgical time-out was performed.  Local was infiltrated at the base of the umbilicus.   Next, a 1-cm vertical infraumbilical incision was made with a #11 blade. The fascia  was grasped and lifted anteriorly. Next, the fascia was incised,  and  the abdominal cavity was entered. Pursestring suture was placed around  the fascial edges using a 0 Vicryl. A 12-mm Hasson trocar was placed.  Pneumoperitoneum was smoothly established up to a patient pressure of 15  mmHg. Laparoscope was advanced. There was no evidence of a  contralateral hernia. The patient had a defect lateral to  the inferior epigastric vessel, consistent with an right indirect  hernia. Two 5-mm trocars were placed, one on the right, one on the left  in the midclavicular line slightly above the level of the umbilicus all  under direct visualization. After local had been infiltrated, I then  made incision along the peritoneum on the right, starting 2 inches above  the anterior superior iliac spine and caring it medial  toward the median umbilical ligament in a lazy S configuration using  Endo Shears with electrocautery. The peritoneal flap was then gently  dissected downward from the anterior abdominal wall taking care not to  injure the inferior epigastric vessels. The pubic bone was identified.  The testicular vessels were identified.  Using  traction and counter traction with short graspers, I reduced the sac in  its entirety. The testicular vessels had been identified and preserved. The vas deferens was identified and preserved, and the hernia sac was stripped from those to  surrounding structures. I then went about creating a large pocket by  lifting the peritoneum of the pelvic floor. I took great care not to  injure the iliac vessels.    Local anesthetic was injected 2 finger breadths below and medial to the anterior superior iliac spine as well as along the right groin prior to placing the mesh.  I then obtained a large piece of Bard 3D max mesh, placed it through the Hasson trocar, half of it covered medial  to the inferior epigastric vessels and half of it lateral to the  inferior epigastric vessels. The defect was well  covered with the mesh. I then  secured the mesh to the abdominal wall  using an Ethicon secure strap tack using three tack. Tacks were placed through  the Cooper's ligament, one tack on medial side of the inferior epigastric  vessel and 1 tack out laterally. No tacks were placed below the  shelving edge of the inguinal ligament. Pneumoperitoneum was reduced  to 8 mmHg. I then brought the peritoneal flap back up to the abdominal  wall and tacked it to the abdominal wall using 3 tacks. There was no  defect in the peritoneum, and the mesh was well covered. A bilateral laparoscopic tap block was placed on bilateral lateral abdominal walls.  I removed the  Hasson trocar and tied down the previously placed pursestring suture.  The closure was viewed laparoscopically. There was no evidence of  fascial defect. There was no air leak at the umbilicus. There was no  evidence of injury to surrounding structures. Pneumoperitoneum was  released, and the remaining trocars were removed. All skin incisions  were closed with a 4-0 Monocryl in a subcuticular fashion followed by  application of Dermabond. All needle, instrument, and sponge counts  were correct x2. There are no immediate complications. The patient  tolerated the procedure well. The patient was extubated and taken to the  recovery room in stable condition.  Mary SellaEric M. Andrey CampanileWilson, MD, FACS General, Bariatric, & Minimally Invasive Surgery [facility], [place]

## 2019-04-03 NOTE — Anesthesia Postprocedure Evaluation (Signed)
Anesthesia Post Note  Patient: Manuel [name]  Procedure(s) Performed: LAPAROSCOPIC RIGHT INGUINAL HERNIA WITH MESH (Right Abdomen)     Patient location during evaluation: PACU Anesthesia Type: General Level of consciousness: awake and alert Pain management: pain level controlled Vital Signs Assessment: post-procedure vital signs reviewed and stable Respiratory status: spontaneous breathing, nonlabored ventilation and respiratory function stable Cardiovascular status: blood pressure returned to baseline and stable Postop Assessment: no apparent nausea or vomiting Anesthetic complications: no    Last Vitals:  Vitals:   04/03/19 1415 04/03/19 1420  BP: (!) 122/59 (!) 126/58  Pulse: 73 87  Resp: 12 18  Temp:  36.5 C  SpO2: 94% 96%    Last Pain:  Vitals:   04/03/19 1420  TempSrc:   PainSc: 1                  Laporsche Hoeger,W. EDMOND

## 2019-04-03 NOTE — H&P (Signed)
Manuel [name] is an 78 y.o. male.   Chief Complaint: here for surgery HPI: 78 yo male presents for inguinal hernia surgery. Denies any changes since seen in clinic.   The patient is a 78 year old male who presents with an inguinal hernia. He is referred by Dr. Sharlett Iles for evaluation of a right inguinal hernia. He states about a week or 2 he started noticing some pain and discomfort and a bulge in his right groin. He states the bulge comes and goes. It is not there in the morning but progressively becomes prominent throughout the day especially at the end of the day. He will tend to have discomfort later in the day. He describes it as a dull ache. He feels it more in the evening. He is quite active. He plays golf several times a week and walks up to 6 miles per day. However the hernia is causing some interference with his walking and coughing due to the discomfort. He does have some BPH issues. He denies any prior abdominal surgery. He denies any chest pain, chest pressure, source of breath, orthopnea, TIAs or amaurosis fugax. He is a remote smoker quit many years ago. No drug or significant alcohol use. No blood thinners. He does live alone  Past Medical History:  Diagnosis Date  . Arthritis    bi lat feet  . Hyperlipidemia   . Hypertension     Past Surgical History:  Procedure Laterality Date  . COLONOSCOPY  2005  . EYE SURGERY      Family History  Problem Relation Age of Onset  . Colon cancer Mother 2   Social History:  reports that he quit smoking about 45 years ago. His smoking use included cigarettes. He has a 10.00 pack-year smoking history. He has never used smokeless tobacco. He reports current alcohol use of about 3.0 standard drinks of alcohol per week. He reports that he does not use drugs.  Allergies: No Known Allergies  Facility-Administered Medications Prior to Admission  Medication Dose Route Frequency Provider Last Rate Last Dose  . triamcinolone  acetonide (KENALOG) 10 MG/ML injection 10 mg  10 mg Other Once Regal, Norman S, DPM      . triamcinolone acetonide (KENALOG) 10 MG/ML injection 10 mg  10 mg Other Once Wallene Huh, DPM       Medications Prior to Admission  Medication Sig Dispense Refill  . amLODipine (NORVASC) 2.5 MG tablet Take 2.5 mg by mouth daily.    Marland Kitchen aspirin 81 MG tablet Take 81 mg by mouth daily.    Marland Kitchen atorvastatin (LIPITOR) 10 MG tablet Take 10 mg by mouth daily.    . tamsulosin (FLOMAX) 0.4 MG CAPS capsule Take 0.4 mg by mouth daily.      No results found for this or any previous visit (from the past 48 hour(s)). No results found.  Review of Systems  Constitutional: Negative for weight loss.  HENT: Negative for nosebleeds.   Eyes: Negative for blurred vision.  Respiratory: Negative for shortness of breath.   Cardiovascular: Negative for chest pain, palpitations, orthopnea and PND.       Denies DOE  Genitourinary: Negative for dysuria and hematuria.  Musculoskeletal: Negative.   Skin: Negative for itching and rash.  Neurological: Negative for dizziness, focal weakness, seizures, loss of consciousness and headaches.       Denies TIAs, amaurosis fugax  Endo/Heme/Allergies: Does not bruise/bleed easily.  Psychiatric/Behavioral: The patient is not nervous/anxious.     Blood pressure Marland Kitchen)  178/82, pulse 78, temperature 97.8 F (36.6 C), temperature source Oral, resp. rate 20, height 5\' 10"  (1.778 m), weight 80.9 kg, SpO2 100 %. Physical Exam  Vitals reviewed. Constitutional: He is oriented to person, place, and time. He appears well-developed and well-nourished. No distress.  HENT:  Head: Normocephalic and atraumatic.  Right Ear: External ear normal.  Left Ear: External ear normal.  Eyes: Conjunctivae are normal. No scleral icterus.  Neck: Normal range of motion. Neck supple. No tracheal deviation present. No thyromegaly present.  Cardiovascular: Normal rate and normal heart sounds.  Respiratory: Effort  normal and breath sounds normal. No stridor. No respiratory distress. He has no wheezes.  GI: Soft. He exhibits no distension. There is no abdominal tenderness. There is no rebound. A hernia is present. Hernia confirmed positive in the right inguinal area.  Musculoskeletal:        General: No tenderness or edema.  Lymphadenopathy:    He has no cervical adenopathy.  Neurological: He is alert and oriented to person, place, and time. He exhibits normal muscle tone.  Skin: Skin is warm and dry. No rash noted. He is not diaphoretic. No erythema. No pallor.  Psychiatric: He has a normal mood and affect. His behavior is normal. Judgment and thought content normal.     Assessment/Plan Right inguinal hernia BPH HTN  ERAS protocol All questions asked/answer  Mary SellaEric M. Andrey CampanileWilson, MD, FACS General, Bariatric, & Minimally Invasive Surgery [facility] [place]   Gaynelle AduEric Chayne Baumgart, MD 04/03/2019, 11:27 AM

## 2019-04-03 NOTE — Anesthesia Procedure Notes (Signed)
Procedure Name: Intubation Date/Time: 04/03/2019 12:05 PM Performed by: Mitzie Na, CRNA Pre-anesthesia Checklist: Patient identified, Emergency Drugs available, Suction available and Patient being monitored Patient Re-evaluated:Patient Re-evaluated prior to induction Oxygen Delivery Method: Circle system utilized Preoxygenation: Pre-oxygenation with 100% oxygen Induction Type: IV induction and Rapid sequence Laryngoscope Size: Mac and 3 Grade View: Grade I Tube type: Oral Tube size: 7.5 mm Number of attempts: 1 Airway Equipment and Method: Stylet and Oral airway Placement Confirmation: ETT inserted through vocal cords under direct vision,  positive ETCO2 and breath sounds checked- equal and bilateral Secured at: 22 cm Tube secured with: Tape Dental Injury: Teeth and Oropharynx as per pre-operative assessment

## 2019-04-03 NOTE — Transfer of Care (Signed)
Immediate Anesthesia Transfer of Care Note  Patient: Manuel [name]  Procedure(s) Performed: LAPAROSCOPIC RIGHT INGUINAL HERNIA WITH MESH (Right Abdomen)  Patient Location: PACU  Anesthesia Type:General  Level of Consciousness: awake, alert , oriented and patient cooperative  Airway & Oxygen Therapy: Patient Spontanous Breathing and Patient connected to face mask oxygen  Post-op Assessment: Report given to RN, Post -op Vital signs reviewed and stable and Patient moving all extremities  Post vital signs: Reviewed and stable  Last Vitals:  Vitals Value Taken Time  BP 144/72 04/03/19 1326  Temp    Pulse 75 04/03/19 1327  Resp 12 04/03/19 1327  SpO2 100 % 04/03/19 1327  Vitals shown include unvalidated device data.  Last Pain:  Vitals:   04/03/19 1029  TempSrc: Oral      Patients Stated Pain Goal: 3 (78/67/67 2094)  Complications: No apparent anesthesia complications

## 2019-04-03 NOTE — Discharge Instructions (Signed)
[facility] °LAPAROSCOPIC SURGERY: POST OP INSTRUCTIONS °Always review your discharge instruction sheet given to you by the facility where your surgery was performed. °IF YOU HAVE DISABILITY OR FAMILY LEAVE FORMS, YOU MUST BRING THEM TO THE OFFICE FOR PROCESSING.   °DO NOT GIVE THEM TO YOUR DOCTOR. ° °PAIN CONTROL ° °1. First take acetaminophen (Tylenol) AND/or ibuprofen (Advil) to control your pain after surgery.  Follow directions on package.  Taking acetaminophen (Tylenol) and/or ibuprofen (Advil) regularly after surgery will help to control your pain and lower the amount of prescription pain medication you may need.  You should not take more than 3,000 mg (3 grams) of acetaminophen (Tylenol) in 24 hours.  You should not take ibuprofen (Advil), aleve, motrin, naprosyn or other NSAIDS if you have a history of stomach ulcers or chronic kidney disease.  °2. A prescription for pain medication may be given to you upon discharge.  Take your pain medication as prescribed, if you still have uncontrolled pain after taking acetaminophen (Tylenol) or ibuprofen (Advil). °3. Use ice packs to help control pain. °4. If you need a refill on your pain medication, please contact your pharmacy.  They will contact our office to request authorization. Prescriptions will not be filled after 5pm or on week-ends. ° °HOME MEDICATIONS °5. Take your usually prescribed medications unless otherwise directed. ° °DIET °6. You should follow a light diet the first few days after arrival home.  Be sure to include lots of fluids daily. Avoid fatty, fried foods.  ° °CONSTIPATION °7. It is common to experience some constipation after surgery and if you are taking pain medication.  Increasing fluid intake and taking a stool softener (such as Colace) will usually help or prevent this problem from occurring.  A mild laxative (Milk of Magnesia or Miralax) should be taken according to package instructions if there are no bowel  movements after 48 hours. ° °WOUND/INCISION CARE °8. Most patients will experience some swelling and bruising in the area of the incisions.  Ice packs will help.  Swelling and bruising can take several days to resolve.  °9. Unless discharge instructions indicate otherwise, follow guidelines below  °a. STERI-STRIPS - you may remove your outer bandages 48 hours after surgery, and you may shower at that time.  You have steri-strips (small skin tapes) in place directly over the incision.  These strips should be left on the skin for 7-10 days.   °b. DERMABOND/SKIN GLUE - you may shower in 24 hours.  The glue will flake off over the next 2-3 weeks. °10. Any sutures or staples will be removed at the office during your follow-up visit. ° °ACTIVITIES °11. You may resume regular (light) daily activities beginning the next day--such as daily self-care, walking, climbing stairs--gradually increasing activities as tolerated.  You may have sexual intercourse when it is comfortable.  Refrain from any heavy lifting or straining until approved by your doctor. °a. You may drive when you are no longer taking prescription pain medication, you can comfortably wear a seatbelt, and you can safely maneuver your car and apply brakes. ° °FOLLOW-UP °12. You should see your doctor in the office for a follow-up appointment approximately 2-3 weeks after your surgery.  You should have been given your post-op/follow-up appointment when your surgery was scheduled.  If you did not receive a post-op/follow-up appointment, make sure that you call for this appointment within a day or two after you arrive home to insure a convenient appointment time. ° °OTHER   INSTRUCTIONS 13. DO NOT LIFT/PUSH/PULL ANYTHING GREATER THAN 10LBS FOR 4 WEEKS  WHEN TO CALL YOUR DOCTOR: 1. Fever over 101.0 2. Inability to urinate 3. Continued bleeding from incision. 4. Increased pain, redness, or drainage from the incision. 5. Increasing abdominal pain  The clinic  staff is available to answer your questions during regular business hours.  Please dont hesitate to call and ask to speak to one of the nurses for clinical concerns.  If you have a medical emergency, go to the nearest emergency room or call 911.  A surgeon from [facility] is always on call at the hospital. [address], [place], [place], [place]  [address] ? [address], [place], [place]   [address] [phone] ? [phone] ? FAX [phone] Web site: [email or web address]    Managing Your Pain After Surgery Without Opioids    Thank you for participating in our program to help patients manage their pain after surgery without opioids. This is part of our effort to provide you with the best care possible, without exposing you or your family to the risk that opioids pose.  What pain can I expect after surgery? You can expect to have some pain after surgery. This is normal. The pain is typically worse the day after surgery, and quickly begins to get better. Many studies have found that many patients are able to manage their pain after surgery with Over-the-Counter (OTC) medications such as Tylenol and Motrin. If you have a condition that does not allow you to take Tylenol or Motrin, notify your surgical team.  How will I manage my pain? The best strategy for controlling your pain after surgery is around the clock pain control with Tylenol (acetaminophen) and Motrin (ibuprofen or Advil). Alternating these medications with each other allows you to maximize your pain control. In addition to Tylenol and Motrin, you can use heating pads or ice packs on your incisions to help reduce your pain.  How will I alternate your regular strength over-the-counter pain medication? You will take a dose of pain medication every three hours. ; Start by taking 650 mg of Tylenol (2 pills of 325 mg) ; 3 hours later take 600 mg of Motrin (3 pills of 200 mg) ; 3 hours after  taking the Motrin take 650 mg of Tylenol ; 3 hours after that take 600 mg of Motrin.   - 1 -  See example - if your first dose of Tylenol is at 12:00 PM   12:00 PM Tylenol 650 mg (2 pills of 325 mg)  3:00 PM Motrin 600 mg (3 pills of 200 mg)  6:00 PM Tylenol 650 mg (2 pills of 325 mg)  9:00 PM Motrin 600 mg (3 pills of 200 mg)  Continue alternating every 3 hours   We recommend that you follow this schedule around-the-clock for at least 3 days after surgery, or until you feel that it is no longer needed. Use the table on the last page of this handout to keep track of the medications you are taking. Important: Do not take more than 3000mg  of Tylenol or 1800mg  of Motrin in a 24-hour period. Do not take ibuprofen/Motrin if you have a history of bleeding stomach ulcers, severe kidney disease, &/or actively taking a blood thinner  What if I still have pain? If you have pain that is not controlled with the over-the-counter pain medications (Tylenol and Motrin or Advil) you might have what we call breakthrough pain. You will receive a  prescription for a small amount of an opioid pain medication such as Oxycodone, Tramadol, or Tylenol with Codeine. Use these opioid pills in the first 24 hours after surgery if you have breakthrough pain. Do not take more than 1 pill every 4-6 hours.  If you still have uncontrolled pain after using all opioid pills, don't hesitate to call our staff using the number provided. We will help make sure you are managing your pain in the best way possible, and if necessary, we can provide a prescription for additional pain medication.   Day 1    Time  Name of Medication Number of pills taken  Amount of Acetaminophen  Pain Level   Comments  AM PM       AM PM       AM PM       AM PM       AM PM       AM PM       AM PM       AM PM       Total Daily amount of Acetaminophen Do not take more than  3,000 mg per day      Day 2    Time  Name of Medication  Number of pills taken  Amount of Acetaminophen  Pain Level   Comments  AM PM       AM PM       AM PM       AM PM       AM PM       AM PM       AM PM       AM PM       Total Daily amount of Acetaminophen Do not take more than  3,000 mg per day      Day 3    Time  Name of Medication Number of pills taken  Amount of Acetaminophen  Pain Level   Comments  AM PM       AM PM       AM PM       AM PM          AM PM       AM PM       AM PM       AM PM       Total Daily amount of Acetaminophen Do not take more than  3,000 mg per day      Day 4    Time  Name of Medication Number of pills taken  Amount of Acetaminophen  Pain Level   Comments  AM PM       AM PM       AM PM       AM PM       AM PM       AM PM       AM PM       AM PM       Total Daily amount of Acetaminophen Do not take more than  3,000 mg per day      Day 5    Time  Name of Medication Number of pills taken  Amount of Acetaminophen  Pain Level   Comments  AM PM       AM PM       AM PM       AM PM       AM PM  AM PM       AM PM       AM PM       Total Daily amount of Acetaminophen Do not take more than  3,000 mg per day       Day 6    Time  Name of Medication Number of pills taken  Amount of Acetaminophen  Pain Level  Comments  AM PM       AM PM       AM PM       AM PM       AM PM       AM PM       AM PM       AM PM       Total Daily amount of Acetaminophen Do not take more than  3,000 mg per day      Day 7    Time  Name of Medication Number of pills taken  Amount of Acetaminophen  Pain Level   Comments  AM PM       AM PM       AM PM       AM PM       AM PM       AM PM       AM PM       AM PM       Total Daily amount of Acetaminophen Do not take more than  3,000 mg per day        For additional information about how and where to safely dispose of unused opioid medications - [employer]  Disclaimer: This document  contains information and/or instructional materials adapted from [place] for the typical patient with your condition. It does not replace medical advice from your health care provider because your experience may differ from that of the typical patient. Talk to your health care provider if you have any questions about this document, your condition or your treatment plan. Adapted from [place]

## 2019-04-03 NOTE — Anesthesia Preprocedure Evaluation (Addendum)
Anesthesia Evaluation  Patient identified by MRN, date of birth, ID band Patient awake    Reviewed: Allergy & Precautions, H&P , NPO status , Patient's Chart, lab work & pertinent test results  Airway Mallampati: II  TM Distance: >3 FB Neck ROM: Full    Dental no notable dental hx. (+) Teeth Intact, Dental Advisory Given   Pulmonary neg pulmonary ROS, former smoker,    Pulmonary exam normal breath sounds clear to auscultation       Cardiovascular hypertension, Pt. on medications negative cardio ROS   Rhythm:Regular Rate:Normal     Neuro/Psych negative neurological ROS  negative psych ROS   GI/Hepatic negative GI ROS, Neg liver ROS,   Endo/Other  negative endocrine ROS  Renal/GU negative Renal ROS  negative genitourinary   Musculoskeletal   Abdominal   Peds  Hematology negative hematology ROS (+)   Anesthesia Other Findings   Reproductive/Obstetrics negative OB ROS                            Anesthesia Physical Anesthesia Plan  ASA: II  Anesthesia Plan: General   Post-op Pain Management:    Induction: Intravenous  PONV Risk Score and Plan: 3 and Ondansetron, Dexamethasone and Midazolam  Airway Management Planned: Oral ETT  Additional Equipment:   Intra-op Plan:   Post-operative Plan: Extubation in OR  Informed Consent: I have reviewed the patients History and Physical, chart, labs and discussed the procedure including the risks, benefits and alternatives for the proposed anesthesia with the patient or authorized representative who has indicated his/her understanding and acceptance.     Dental advisory given  Plan Discussed with: CRNA  Anesthesia Plan Comments:         Anesthesia Quick Evaluation

## 2019-04-04 ENCOUNTER — Encounter (HOSPITAL_COMMUNITY): Payer: Self-pay | Admitting: General Surgery

## 2019-04-30 ENCOUNTER — Other Ambulatory Visit (HOSPITAL_COMMUNITY): Payer: Medicare Other

## 2019-08-15 ENCOUNTER — Ambulatory Visit: Payer: Medicare Other

## 2019-11-15 ENCOUNTER — Ambulatory Visit (INDEPENDENT_AMBULATORY_CARE_PROVIDER_SITE_OTHER): Payer: Medicare Other

## 2019-11-15 ENCOUNTER — Other Ambulatory Visit: Payer: Self-pay | Admitting: Podiatry

## 2019-11-15 ENCOUNTER — Other Ambulatory Visit: Payer: Self-pay

## 2019-11-15 ENCOUNTER — Encounter: Payer: Self-pay | Admitting: Podiatry

## 2019-11-15 ENCOUNTER — Ambulatory Visit: Payer: Medicare Other | Admitting: Podiatry

## 2019-11-15 VITALS — Temp 98.0°F

## 2019-11-15 DIAGNOSIS — M722 Plantar fascial fibromatosis: Secondary | ICD-10-CM

## 2019-11-15 DIAGNOSIS — M79671 Pain in right foot: Secondary | ICD-10-CM

## 2019-11-15 DIAGNOSIS — M79674 Pain in right toe(s): Secondary | ICD-10-CM | POA: Diagnosis not present

## 2019-11-15 DIAGNOSIS — M79675 Pain in left toe(s): Secondary | ICD-10-CM | POA: Diagnosis not present

## 2019-11-15 DIAGNOSIS — B351 Tinea unguium: Secondary | ICD-10-CM

## 2019-11-15 NOTE — Progress Notes (Signed)
Subjective:   Patient ID: Manuel [name], male   DOB: 79 y.o.   MRN: [id number]   HPI Patient presents stating that my heel has become very sore recently and also my nails are thickened I cannot cut them myself and they become painful in shoe gear   ROS      Objective:  Physical Exam  Neurovascular status intact with exquisite discomfort plantar aspect right heel with thickened nailbeds 1-5 both feet that become dystrophic and irritated     Assessment:  Acute plantar fasciitis right with mycotic nail infection bilateral     Plan:  H&P conditions reviewed and I went ahead did sterile prep injected the fascia right 3 mg Kenalog 5 mg Xylocaine and I debrided nailbeds 1-5 both feet with no iatrogenic bleeding noted

## 2019-11-15 NOTE — Patient Instructions (Signed)

## 2020-02-15 ENCOUNTER — Encounter: Payer: Self-pay | Admitting: Podiatry

## 2020-02-15 ENCOUNTER — Other Ambulatory Visit: Payer: Self-pay

## 2020-02-15 ENCOUNTER — Ambulatory Visit: Payer: Medicare Other | Admitting: Podiatry

## 2020-02-15 DIAGNOSIS — M79674 Pain in right toe(s): Secondary | ICD-10-CM | POA: Diagnosis not present

## 2020-02-15 DIAGNOSIS — M205X9 Other deformities of toe(s) (acquired), unspecified foot: Secondary | ICD-10-CM

## 2020-02-15 DIAGNOSIS — B351 Tinea unguium: Secondary | ICD-10-CM | POA: Diagnosis not present

## 2020-02-15 DIAGNOSIS — M79675 Pain in left toe(s): Secondary | ICD-10-CM

## 2020-02-15 NOTE — Progress Notes (Signed)
This patient presents to the office with chief complaint of long thick painful nails.  Patient says the nails are painful walking and wearing shoes.  This patient is unable to self treat.  This patient is unable to trim his nails since she is unable to reach his nails.  She presents to the office for preventative foot care services.  General Appearance  Alert, conversant and in no acute stress.  Vascular  Dorsalis pedis and posterior tibial  pulses are palpable  bilaterally.  Capillary return is within normal limits  bilaterally. Temperature is within normal limits  bilaterally.  Neurologic  Senn-Weinstein monofilament wire test within normal limits  bilaterally. Muscle power within normal limits bilaterally.  Nails Thick disfigured discolored nails with subungual debris  from hallux to fifth toes bilaterally. No evidence of bacterial infection or drainage bilaterally.  Orthopedic  No limitations of motion  feet .  No crepitus or effusions noted.  No bony pathology or digital deformities noted. Hallux limitus  B/L.  Skin  normotropic skin with no porokeratosis noted bilaterally.  No signs of infections or ulcers noted.     Onychomycosis  Nails  B/L.  Pain in right toes  Pain in left toes  Debridement of nails both feet followed trimming the nails with dremel tool.    RTC 10 weeks    Tequita Marrs DPM  

## 2020-04-01 ENCOUNTER — Ambulatory Visit: Payer: Medicare Other | Attending: Internal Medicine

## 2020-04-01 DIAGNOSIS — Z23 Encounter for immunization: Secondary | ICD-10-CM

## 2020-04-01 NOTE — Progress Notes (Signed)
   Covid-19 Vaccination Clinic  Name:  ADRIENNE DELAY    MRN: [id number] DOB: 1940/09/08  04/01/2020  Mr. Gambale was observed post Covid-19 immunization for 15 minutes without incident. He was provided with Vaccine Information Sheet and instruction to access the V-Safe system.   Mr. Pomplun was instructed to call 911 with any severe reactions post vaccine: Marland Kitchen Difficulty breathing  . Swelling of face and throat  . A fast heartbeat  . A bad rash all over body  . Dizziness and weakness

## 2020-04-25 ENCOUNTER — Other Ambulatory Visit: Payer: Self-pay

## 2020-04-25 ENCOUNTER — Encounter: Payer: Self-pay | Admitting: Podiatry

## 2020-04-25 ENCOUNTER — Ambulatory Visit: Payer: Medicare Other | Admitting: Podiatry

## 2020-04-25 DIAGNOSIS — B351 Tinea unguium: Secondary | ICD-10-CM

## 2020-04-25 DIAGNOSIS — M79675 Pain in left toe(s): Secondary | ICD-10-CM | POA: Diagnosis not present

## 2020-04-25 DIAGNOSIS — M205X9 Other deformities of toe(s) (acquired), unspecified foot: Secondary | ICD-10-CM | POA: Diagnosis not present

## 2020-04-25 DIAGNOSIS — M79674 Pain in right toe(s): Secondary | ICD-10-CM | POA: Diagnosis not present

## 2020-04-25 NOTE — Progress Notes (Signed)
This patient presents to the office with chief complaint of long thick painful nails.  Patient says the nails are painful walking and wearing shoes.  This patient is unable to self treat.  This patient is unable to trim his nails since she is unable to reach his nails.  She presents to the office for preventative foot care services.  General Appearance  Alert, conversant and in no acute stress.  Vascular  Dorsalis pedis and posterior tibial  pulses are palpable  bilaterally.  Capillary return is within normal limits  bilaterally. Temperature is within normal limits  bilaterally.  Neurologic  Senn-Weinstein monofilament wire test within normal limits  bilaterally. Muscle power within normal limits bilaterally.  Nails Thick disfigured discolored nails with subungual debris  from hallux to fifth toes bilaterally. No evidence of bacterial infection or drainage bilaterally.  Orthopedic  No limitations of motion  feet .  No crepitus or effusions noted.  No bony pathology or digital deformities noted. Hallux limitus  B/L.  Skin  normotropic skin with no porokeratosis noted bilaterally.  No signs of infections or ulcers noted.     Onychomycosis  Nails  B/L.  Pain in right toes  Pain in left toes  Debridement of nails both feet followed trimming the nails with dremel tool.    RTC 10 weeks    Rye Decoste DPM  

## 2020-07-16 ENCOUNTER — Encounter: Payer: Self-pay | Admitting: Podiatry

## 2020-07-16 ENCOUNTER — Other Ambulatory Visit: Payer: Self-pay

## 2020-07-16 ENCOUNTER — Ambulatory Visit: Payer: Medicare Other | Admitting: Podiatry

## 2020-07-16 DIAGNOSIS — M79675 Pain in left toe(s): Secondary | ICD-10-CM

## 2020-07-16 DIAGNOSIS — M79674 Pain in right toe(s): Secondary | ICD-10-CM

## 2020-07-16 DIAGNOSIS — B351 Tinea unguium: Secondary | ICD-10-CM | POA: Diagnosis not present

## 2020-07-16 DIAGNOSIS — M205X9 Other deformities of toe(s) (acquired), unspecified foot: Secondary | ICD-10-CM | POA: Diagnosis not present

## 2020-07-16 NOTE — Progress Notes (Signed)
This patient presents to the office with chief complaint of long thick painful nails.  Patient says the nails are painful walking and wearing shoes.  This patient is unable to self treat.  This patient is unable to trim his nails since she is unable to reach his nails.  She presents to the office for preventative foot care services.  General Appearance  Alert, conversant and in no acute stress.  Vascular  Dorsalis pedis and posterior tibial  pulses are palpable  bilaterally.  Capillary return is within normal limits  bilaterally. Temperature is within normal limits  bilaterally.  Neurologic  Senn-Weinstein monofilament wire test within normal limits  bilaterally. Muscle power within normal limits bilaterally.  Nails Thick disfigured discolored nails with subungual debris  from hallux to fifth toes bilaterally. No evidence of bacterial infection or drainage bilaterally.  Orthopedic  No limitations of motion  feet .  No crepitus or effusions noted.  No bony pathology or digital deformities noted. Hallux limitus  B/L.  Skin  normotropic skin with no porokeratosis noted bilaterally.  No signs of infections or ulcers noted.     Onychomycosis  Nails  B/L.  Pain in right toes  Pain in left toes  Debridement of nails both feet followed trimming the nails with dremel tool.    RTC 10 weeks    Eligha Kmetz DPM  

## 2020-09-24 ENCOUNTER — Ambulatory Visit: Payer: Medicare Other | Admitting: Podiatry

## 2020-09-24 ENCOUNTER — Encounter: Payer: Self-pay | Admitting: Podiatry

## 2020-09-24 ENCOUNTER — Other Ambulatory Visit: Payer: Self-pay

## 2020-09-24 DIAGNOSIS — B351 Tinea unguium: Secondary | ICD-10-CM | POA: Diagnosis not present

## 2020-09-24 DIAGNOSIS — M79674 Pain in right toe(s): Secondary | ICD-10-CM

## 2020-09-24 DIAGNOSIS — M79675 Pain in left toe(s): Secondary | ICD-10-CM

## 2020-09-24 DIAGNOSIS — M205X9 Other deformities of toe(s) (acquired), unspecified foot: Secondary | ICD-10-CM

## 2020-09-24 DIAGNOSIS — M79671 Pain in right foot: Secondary | ICD-10-CM | POA: Diagnosis not present

## 2020-09-24 NOTE — Progress Notes (Signed)
This patient presents to the office with chief complaint of long thick painful nails.  Patient says the nails are painful walking and wearing shoes.  This patient is unable to self treat.  This patient is unable to trim his nails since she is unable to reach his nails.  She presents to the office for preventative foot care services.  General Appearance  Alert, conversant and in no acute stress.  Vascular  Dorsalis pedis and posterior tibial  pulses are palpable  bilaterally.  Capillary return is within normal limits  bilaterally. Temperature is within normal limits  bilaterally.  Neurologic  Senn-Weinstein monofilament wire test within normal limits  bilaterally. Muscle power within normal limits bilaterally.  Nails Thick disfigured discolored nails with subungual debris  from hallux to fifth toes bilaterally. No evidence of bacterial infection or drainage bilaterally.  Orthopedic  No limitations of motion  feet .  No crepitus or effusions noted.  No bony pathology or digital deformities noted. Hallux limitus  B/L.  Skin  normotropic skin with no porokeratosis noted bilaterally.  No signs of infections or ulcers noted.     Onychomycosis  Nails  B/L.  Pain in right toes  Pain in left toes  Debridement of nails both feet followed trimming the nails with dremel tool.    RTC 10 weeks    Helane Gunther DPM

## 2020-12-03 ENCOUNTER — Other Ambulatory Visit: Payer: Self-pay

## 2020-12-03 ENCOUNTER — Encounter: Payer: Self-pay | Admitting: Podiatry

## 2020-12-03 ENCOUNTER — Ambulatory Visit: Payer: Medicare Other | Admitting: Podiatry

## 2020-12-03 DIAGNOSIS — B351 Tinea unguium: Secondary | ICD-10-CM | POA: Diagnosis not present

## 2020-12-03 DIAGNOSIS — M205X9 Other deformities of toe(s) (acquired), unspecified foot: Secondary | ICD-10-CM

## 2020-12-03 DIAGNOSIS — M79675 Pain in left toe(s): Secondary | ICD-10-CM | POA: Diagnosis not present

## 2020-12-03 DIAGNOSIS — M79674 Pain in right toe(s): Secondary | ICD-10-CM

## 2020-12-03 NOTE — Progress Notes (Signed)
This patient presents to the office with chief complaint of long thick painful nails.  Patient says the nails are painful walking and wearing shoes.  This patient is unable to self treat.  This patient is unable to trim his nails since she is unable to reach his nails.  She presents to the office for preventative foot care services.  General Appearance  Alert, conversant and in no acute stress.  Vascular  Dorsalis pedis and posterior tibial  pulses are palpable  bilaterally.  Capillary return is within normal limits  bilaterally. Temperature is within normal limits  bilaterally.  Neurologic  Senn-Weinstein monofilament wire test within normal limits  bilaterally. Muscle power within normal limits bilaterally.  Nails Thick disfigured discolored nails with subungual debris  from hallux to fifth toes bilaterally. No evidence of bacterial infection or drainage bilaterally.  Orthopedic  No limitations of motion  feet .  No crepitus or effusions noted.  No bony pathology or digital deformities noted. Hallux limitus  B/L.  Skin  normotropic skin with no porokeratosis noted bilaterally.  No signs of infections or ulcers noted.     Onychomycosis  Nails  B/L.  Pain in right toes  Pain in left toes  Debridement of nails both feet followed trimming the nails with dremel tool.    RTC 10 weeks    Helane Gunther DPM

## 2020-12-05 ENCOUNTER — Ambulatory Visit: Payer: Medicare Other | Admitting: Podiatry

## 2021-02-13 ENCOUNTER — Ambulatory Visit: Payer: Medicare Other | Admitting: Podiatry

## 2021-02-25 ENCOUNTER — Telehealth: Payer: Self-pay

## 2021-02-25 NOTE — Telephone Encounter (Signed)
Pt left v/m about finding a card for Harlin Heys FNP on [employer] while walking and requested cb. I spoke with pt and he said Harlin Heys FNP had already contacted Mr Ganaway and was coming to pick up her visa card. Nothing further needed.

## 2023-06-02 ENCOUNTER — Other Ambulatory Visit: Payer: Self-pay | Admitting: Internal Medicine

## 2023-06-02 DIAGNOSIS — M5030 Other cervical disc degeneration, unspecified cervical region: Secondary | ICD-10-CM

## 2023-06-27 ENCOUNTER — Ambulatory Visit
Admission: RE | Admit: 2023-06-27 | Discharge: 2023-06-27 | Disposition: A | Discharge status: Home / Self Care | Payer: Medicare Other | Source: Ambulatory Visit | Attending: Internal Medicine | Admitting: Internal Medicine

## 2023-06-27 DIAGNOSIS — M5030 Other cervical disc degeneration, unspecified cervical region: Secondary | ICD-10-CM

## 2023-08-02 ENCOUNTER — Encounter: Payer: Self-pay | Admitting: Cardiovascular Disease

## 2023-08-02 ENCOUNTER — Ambulatory Visit (INDEPENDENT_AMBULATORY_CARE_PROVIDER_SITE_OTHER): Payer: Medicare Other

## 2023-08-02 ENCOUNTER — Ambulatory Visit: Payer: Medicare Other | Attending: Cardiovascular Disease | Admitting: Cardiovascular Disease

## 2023-08-02 VITALS — BP 148/72 | HR 113 | Ht 70.0 in | Wt 181.0 lb

## 2023-08-02 DIAGNOSIS — E782 Mixed hyperlipidemia: Secondary | ICD-10-CM

## 2023-08-02 DIAGNOSIS — R931 Abnormal findings on diagnostic imaging of heart and coronary circulation: Secondary | ICD-10-CM | POA: Diagnosis not present

## 2023-08-02 DIAGNOSIS — E785 Hyperlipidemia, unspecified: Secondary | ICD-10-CM | POA: Insufficient documentation

## 2023-08-02 DIAGNOSIS — I4819 Other persistent atrial fibrillation: Secondary | ICD-10-CM | POA: Diagnosis not present

## 2023-08-02 DIAGNOSIS — I10 Essential (primary) hypertension: Secondary | ICD-10-CM | POA: Insufficient documentation

## 2023-08-02 DIAGNOSIS — I2510 Atherosclerotic heart disease of native coronary artery without angina pectoris: Secondary | ICD-10-CM | POA: Diagnosis not present

## 2023-08-02 MED ORDER — APIXABAN 5 MG PO TABS: 5.0000 mg | ORAL_TABLET | Freq: Two times a day (BID) | ORAL | 3 refills | Status: DC

## 2023-08-02 MED ORDER — ATORVASTATIN CALCIUM 40 MG PO TABS: 40.0000 mg | ORAL_TABLET | Freq: Every day | ORAL | 3 refills | Status: DC

## 2023-08-02 MED ORDER — METOPROLOL SUCCINATE ER 50 MG PO TB24: 50.0000 mg | ORAL_TABLET | Freq: Every day | ORAL | 3 refills | Status: DC

## 2023-08-02 NOTE — Assessment & Plan Note (Signed)
 EKG shows A-fib with rapid ventricular response today of 113.  It is unclear the duration of his A-fib.  His last EKG in epic was in 2020 when he was in sinus rhythm.  He is asymptomatic from this.  I am only start him on a low-dose beta-blocker for rate control and initiate Eliquis  oral anticoagulation for stroke prophylaxis. This patients CHA2DS2-VASc Score and unadjusted Ischemic Stroke Rate (% per year) is equal to 3.2 % stroke rate/year from a score of 3  Above score calculated as 1 point each if present [CHF, HTN, DM, Vascular=MI/PAD/Aortic Plaque, Age if 65-74, or Male] Above score calculated as 2 points each if present [Age > 75, or Stroke/TIA/TE]

## 2023-08-02 NOTE — Assessment & Plan Note (Signed)
 History of essential hypertension blood pressure measured today of 148/72.  He is on amlodipine.  Given his elevated heart rate.  I am going to add low-dose beta-blocker.

## 2023-08-02 NOTE — Assessment & Plan Note (Signed)
 History of hyperlipidemia on low-dose atorvastatin  with lipid profile performed 05/17/2023 revealing total cholesterol 140, LDL of 89 and HDL 39, not at goal for secondary prevention given his elevated coronary calcium  score of greater than 500.  I am going to increase his atorvastatin  from 10 to 40 mg a day and we will repeat will recheck a lipid liver profile in 3 months, LDL goal less than 70

## 2023-08-02 NOTE — Progress Notes (Unsigned)
 Enrolled for [employer] to mail a ZIO XT long term holter monitor to the patients address on file.

## 2023-08-02 NOTE — Patient Instructions (Signed)
 Medication Instructions:  Your physician has recommended you make the following change in your medication:   -Start apixaban  (Eliquis ) 5mg  twice daily.  -Start metoprolol  succincate (Toprol -XL) 25mg  once daily.  -Increase atorvastatin  (lipitor) to 40mg  once daily.  *If you need a refill on your cardiac medications before your next appointment, please call your pharmacy*    Lab Work: Your physician recommends that you return for lab work in: 3 months for FASTING lipid/liver panel.  If you have labs (blood work) drawn today and your tests are completely normal, you will receive your results only by: MyChart Message (if you have MyChart) OR A paper copy in the mail If you have any lab test that is abnormal or we need to change your treatment, we will call you to review the results.    Testing/Procedures: Your physician has requested that you have an echocardiogram. Echocardiography is a painless test that uses sound waves to create images of your heart. It provides your doctor with information about the size and shape of your heart and how well your heart's chambers and valves are working. This procedure takes approximately one hour. There are no restrictions for this procedure. Please do NOT wear cologne, perfume, aftershave, or lotions (deodorant is allowed). Please arrive 15 minutes prior to your appointment time.  Please note: We ask at that you not bring children with you during ultrasound (echo/ vascular) testing. Due to room size and safety concerns, children are not allowed in the ultrasound rooms during exams. Our front office staff cannot provide observation of children in our lobby area while testing is being conducted. An adult accompanying a patient to their appointment will only be allowed in the ultrasound room at the discretion of the ultrasound technician under special circumstances. We apologize for any inconvenience.   ZIO XT- Long Term Monitor Instructions  Your  physician has requested you wear a ZIO patch monitor for 14 days.  This is a single patch monitor. [employer] supplies one patch monitor per enrollment. Additional stickers are not available. Please do not apply patch if you will be having a Nuclear Stress Test,  Echocardiogram, Cardiac CT, MRI, or Chest Xray during the period you would be wearing the  monitor. The patch cannot be worn during these tests. You cannot remove and re-apply the  ZIO XT patch monitor.  Your ZIO patch monitor will be mailed 3 day USPS to your address on file. It may take 3-5 days  to receive your monitor after you have been enrolled.  Once you have received your monitor, please review the enclosed instructions. Your monitor  has already been registered assigning a specific monitor serial # to you.  Billing and Patient Assistance Program Information  We have supplied [employer] with any of your insurance information on file for billing purposes. [employer] offers a sliding scale Patient Assistance Program for patients that do not have  insurance, or whose insurance does not completely cover the cost of the ZIO monitor.  You must apply for the Patient Assistance Program to qualify for this discounted rate.  To apply, please call [employer] at [phone], select option 4, select option 2, ask to apply for  Patient Assistance Program. Meredeth will ask your household income, and how many people  are in your household. They will quote your out-of-pocket cost based on that information.  [employer] will also be able to set up a 23-month, interest-free payment plan if needed.  Applying the monitor   Shave hair from upper left chest.  Hold abrader disc by orange tab. Rub abrader in 40 strokes over the upper left chest as  indicated in your monitor instructions.  Clean area with 4 enclosed alcohol pads. Let dry.  Apply patch as indicated in monitor instructions. Patch will be placed under collarbone on left  side of chest with arrow  pointing upward.  Rub patch adhesive wings for 2 minutes. Remove white label marked 1. Remove the white  label marked 2. Rub patch adhesive wings for 2 additional minutes.  While looking in a mirror, press and release button in center of patch. A small green light will  flash 3-4 times. This will be your only indicator that the monitor has been turned on.  Do not shower for the first 24 hours. You may shower after the first 24 hours.  Press the button if you feel a symptom. You will hear a small click. Record Date, Time and  Symptom in the Patient Logbook.  When you are ready to remove the patch, follow instructions on the last 2 pages of Patient  Logbook. Stick patch monitor onto the last page of Patient Logbook.  Place Patient Logbook in the blue and white box. Use locking tab on box and tape box closed  securely. The blue and white box has prepaid postage on it. Please place it in the mailbox as  soon as possible. Your physician should have your test results approximately 7 days after the  monitor has been mailed back to [facility].  Call [facility] Customer Care at [phone] if you have questions regarding  your ZIO XT patch monitor. Call them immediately if you see an orange light blinking on your  monitor.  If your monitor falls off in less than 4 days, contact our Monitor department at [phone].  If your monitor becomes loose or falls off after 4 days call [employer] at [phone] for  suggestions on securing your monitor     Follow-Up: At [facility], you and your health needs are our priority.  As part of our continuing mission to provide you with exceptional heart care, we have created designated Provider Care Teams.  These Care Teams include your primary Cardiologist (physician) and Advanced Practice Providers (APPs -  Physician Assistants and Nurse Practitioners) who all work together to provide you with the care you need, when you need it.  We  recommend signing up for the patient portal called MyChart.  Sign up information is provided on this After Visit Summary.  MyChart is used to connect with patients for Virtual Visits (Telemedicine).  Patients are able to view lab/test results, encounter notes, upcoming appointments, etc.  Non-urgent messages can be sent to your provider as well.   To learn more about what you can do with MyChart, go to [email or web address].    Your next appointment:   3 month(s)  Provider:   Dorn Lesches, MD    Other Instructions

## 2023-08-02 NOTE — Assessment & Plan Note (Signed)
 Coronary calcium  score performed 06/22/23 was 514 the majority of which was in the RCA.  He is completely asymptomatic.  He is on low-dose atorvastatin  with a recent lipid profile performed 05/17/2023 revealing total cholesterol 140, LDL of 89, not at goal for secondary prevention.  I am going to increase his atorvastatin  from 10 mg to 40 mg and we will recheck a lipid liver profile in 3 months.  LDL goal less than 70.

## 2023-08-02 NOTE — Progress Notes (Signed)
 08/02/2023 FILLMORE BYNUM   [birth year]  [id number]  Primary Physician Yolande Toribio MATSU, MD Primary Cardiologist: Dorn JINNY Lesches MD FACP, [place], [place], [place]  HPI:  Manuel [name] is a 83 y.o. mild to moderately overweight recently widowed Caucasian male father of 2, grandfather of 5 grandchildren who is a retired psychologist, occupational.  He was referred by Dr. Jakie, his PCP, for cardiovascular valuation because of a recent abnormal coronary calcium  score.  His risk factors include hypertension hyperlipidemia.  He smoked back in the 60s.  There is no family history of heart disease.  Never had a heart attack or stroke.  He denies chest pain or shortness of breath.  He is fairly active and plays golf 3 days a week (carries his bag 18 holes).  He walks walks 40 minutes a day and works in his room.  He did have a coronary calcium  score performed 06/22/23 which was 514 the majority of which was in the RCA.  His EKG shows A-fib with RVR.  Unclear what the chronicity is.  He is however asymptomatic from this.   Current Meds  Medication Sig   amLODipine (NORVASC) 5 MG tablet Take 5 mg by mouth daily.   aspirin 81 MG tablet Take 81 mg by mouth daily.   atorvastatin  (LIPITOR) 10 MG tablet Take 10 mg by mouth daily.   PREVIDENT 5000 BOOSTER PLUS 1.1 % PSTE Place onto teeth as directed.     No Known Allergies  Social History   Socioeconomic History   Marital status: Widowed    Spouse name: Not on file   Number of children: Not on file   Years of education: Not on file   Highest education level: Not on file  Occupational History   Not on file  Tobacco Use   Smoking status: Former    Current packs/day: 0.00    Average packs/day: 1 pack/day for 10.0 years (10.0 ttl pk-yrs)    Types: Cigarettes    Start date: 24    Quit date: 51    Years since quitting: 50.0   Smokeless tobacco: Never  Vaping Use   Vaping status: Never Used  Substance and Sexual Activity   Alcohol use: Yes    Alcohol/week:  3.0 standard drinks of alcohol    Types: 3 Glasses of wine per week   Drug use: No   Sexual activity: Not on file  Other Topics Concern   Not on file  Social History Narrative   Not on file   Social Drivers of Health   Financial Resource Strain: Not on file  Food Insecurity: Not on file  Transportation Needs: Not on file  Physical Activity: Not on file  Stress: Not on file  Social Connections: Not on file  Intimate Partner Violence: Not on file     Review of Systems: General: negative for chills, fever, night sweats or weight changes.  Cardiovascular: negative for chest pain, dyspnea on exertion, edema, orthopnea, palpitations, paroxysmal nocturnal dyspnea or shortness of breath Dermatological: negative for rash Respiratory: negative for cough or wheezing Urologic: negative for hematuria Abdominal: negative for nausea, vomiting, diarrhea, bright red blood per rectum, melena, or hematemesis Neurologic: negative for visual changes, syncope, or dizziness All other systems reviewed and are otherwise negative except as noted above.    Blood pressure (!) 148/72, pulse (!) 113, height 5' 10 (1.778 m), weight 181 lb (82.1 kg), SpO2 97%.  General appearance: alert and no distress Neck: no adenopathy, no  carotid bruit, no JVD, supple, symmetrical, trachea midline, and thyroid not enlarged, symmetric, no tenderness/mass/nodules Lungs: clear to auscultation bilaterally Heart: irregularly irregular rhythm Extremities: extremities normal, atraumatic, no cyanosis or edema Pulses: 2+ and symmetric Skin: Skin color, texture, turgor normal. No rashes or lesions Neurologic: Grossly normal  EKG EKG Interpretation Date/Time:  Tuesday August 02 2023 09:18:11 EST Ventricular Rate:  113 PR Interval:    QRS Duration:  74 QT Interval:  328 QTC Calculation: 449 R Axis:   50  Text Interpretation: Atrial fibrillation with rapid ventricular response When compared with ECG of 28-Mar-2019  09:25, Atrial fibrillation has replaced Sinus rhythm Vent. rate has increased BY  48 BPM Nonspecific T wave abnormality, worse in Inferior leads Confirmed by Court Carrier [phone]) on 08/02/2023 9:27:49 AM    ASSESSMENT AND PLAN:   Persistent atrial fibrillation (HCC) EKG shows A-fib with rapid ventricular response today of 113.  It is unclear the duration of his A-fib.  His last EKG in epic was in 2020 when he was in sinus rhythm.  He is asymptomatic from this.  I am only start him on a low-dose beta-blocker for rate control and initiate Eliquis  oral anticoagulation for stroke prophylaxis. This patients CHA2DS2-VASc Score and unadjusted Ischemic Stroke Rate (% per year) is equal to 3.2 % stroke rate/year from a score of 3  Above score calculated as 1 point each if present [CHF, HTN, DM, Vascular=MI/PAD/Aortic Plaque, Age if 65-74, or Male] Above score calculated as 2 points each if present [Age > 75, or Stroke/TIA/TE]   Elevated coronary artery calcium  score Coronary calcium  score performed 06/22/23 was 514 the majority of which was in the RCA.  He is completely asymptomatic.  He is on low-dose atorvastatin  with a recent lipid profile performed 05/17/2023 revealing total cholesterol 140, LDL of 89, not at goal for secondary prevention.  I am going to increase his atorvastatin  from 10 mg to 40 mg and we will recheck a lipid liver profile in 3 months.  LDL goal less than 70.  Essential hypertension History of essential hypertension blood pressure measured today of 148/72.  He is on amlodipine.  Given his elevated heart rate.  I am going to add low-dose beta-blocker.  Hyperlipidemia History of hyperlipidemia on low-dose atorvastatin  with lipid profile performed 05/17/2023 revealing total cholesterol 140, LDL of 89 and HDL 39, not at goal for secondary prevention given his elevated coronary calcium  score of greater than 500.  I am going to increase his atorvastatin  from 10 to 40 mg a day and we will  repeat will recheck a lipid liver profile in 3 months, LDL goal less than 70     Carrier DOROTHA Court MD [facility], [facility] 08/02/2023 9:51 AM

## 2023-08-03 ENCOUNTER — Encounter: Payer: Self-pay | Admitting: Cardiovascular Disease

## 2023-08-03 NOTE — Telephone Encounter (Signed)
 Patient identification verified by 2 forms. Hilton Lucky, RN    Called and spoke to patient  Patient states:   -does not want to be on unnecessary medications   -his heart rate at home is in the 50-70s   -he checks heart rate on his BP machine   -unclear on what afib is since his heart rate was lower before appointment and when he got home  Informed patient:   -a-fib is irregular heart rate  -a heart rate on 50-70 does not mean he is not in a-fib   -EKG can determine if there is A-fib (completed during OV)   -Metoprolol  is meant to help decrease hear rate, so heart does not beat too fast   -monitor heart rate, out reach if consistently <50, or if SBP <100  Patient verbalized understanding, no questions at this time

## 2023-08-05 ENCOUNTER — Encounter: Payer: Self-pay | Admitting: Cardiovascular Disease

## 2023-08-06 DIAGNOSIS — I4819 Other persistent atrial fibrillation: Secondary | ICD-10-CM | POA: Diagnosis not present

## 2023-08-12 ENCOUNTER — Encounter: Payer: Self-pay | Admitting: Cardiovascular Disease

## 2023-08-12 NOTE — Telephone Encounter (Signed)
Spoke with patient. Pt verified name and DOB. Pt stated he has questions about what to report while in afib. Pt stated his heart rate is usually in the 40s-50s and this morning his HR went up to the 70s. Patient was walking when he saw his heart rate was in the 70s. Patient took prescribed metoprolol succinate 50mg  tablet at 8 am this morning. Patient denied chest pain, sob, dizziness, and feeling lightheaded. I informed pt his heart rate will be irregular when he is in atrial fibrillation. I offered patient an appt with an APP to address any concerns he's having and patient declined.Pt checked his BP while on the phone and it read 149/76. I also informed patient that if his HR is sustaining above 110 and experiencing symptoms mentioned above, to go to ED for evaluation. I advised pt to contact our office if he had further questions. Patient verbalized understanding.   Josie LPN

## 2023-08-24 ENCOUNTER — Ambulatory Visit (HOSPITAL_COMMUNITY): Payer: Medicare Other | Attending: Cardiovascular Disease

## 2023-08-24 DIAGNOSIS — E782 Mixed hyperlipidemia: Secondary | ICD-10-CM | POA: Diagnosis present

## 2023-08-24 DIAGNOSIS — I4819 Other persistent atrial fibrillation: Secondary | ICD-10-CM | POA: Insufficient documentation

## 2023-08-24 DIAGNOSIS — I10 Essential (primary) hypertension: Secondary | ICD-10-CM | POA: Insufficient documentation

## 2023-08-24 DIAGNOSIS — R931 Abnormal findings on diagnostic imaging of heart and coronary circulation: Secondary | ICD-10-CM | POA: Insufficient documentation

## 2023-08-24 DIAGNOSIS — I2510 Atherosclerotic heart disease of native coronary artery without angina pectoris: Secondary | ICD-10-CM | POA: Diagnosis present

## 2023-08-24 LAB — ECHOCARDIOGRAM COMPLETE: S' Lateral: 2.75 cm

## 2023-09-16 ENCOUNTER — Encounter: Payer: Self-pay | Admitting: Cardiovascular Disease

## 2023-09-21 ENCOUNTER — Encounter: Payer: Self-pay | Admitting: Cardiovascular Disease

## 2023-09-21 NOTE — Telephone Encounter (Signed)
 Spoke with pt at his request regarding a-fib and symptomology.  Pt states that when he was seen in January he later found out that he had bronchitis and pneumonia, he wonders if this as a stressor made him go in to a-fib. Advised that it is a possibility but there is not a sure way to know. We discussed pt's zio monitor and that showed that his a-fib burden was about 14%. Pt states that he mostly unaware of when he is in or out of a-fib. Pt states that he is still active and walks about 40-50 minutes daily. Pt is taking his medication as directed. Pt was overwhelmed by diagnosis of a-fib in the absence of symptoms. We did discuss anginal pain and what that may feel like. Pt states that he still wants to enjoy life and have an occasional drink, advised that alcohol in moderation is ok per Dr. Allyson Sabal. Pt has no further questions at this time.

## 2023-10-13 ENCOUNTER — Encounter: Payer: Self-pay | Admitting: Cardiovascular Disease

## 2023-11-03 ENCOUNTER — Encounter: Payer: Self-pay | Admitting: Cardiology

## 2023-11-03 LAB — LIPID PANEL
Chol/HDL Ratio: 3.5 ratio (ref 0.0–5.0)
Cholesterol, Total: 112 mg/dL (ref 100–199)
HDL: 32 mg/dL — ABNORMAL LOW (ref >39)
LDL Chol Calc (NIH): 68 mg/dL (ref 0–99)
Triglycerides: 52 mg/dL (ref 0–149)
VLDL Cholesterol Cal: 12 mg/dL (ref 5–40)

## 2023-11-03 LAB — HEPATIC FUNCTION PANEL
ALT: 55 IU/L — ABNORMAL HIGH (ref 0–44)
AST: 29 IU/L (ref 0–40)
Albumin: 4 g/dL (ref 3.7–4.7)
Alkaline Phosphatase: 85 IU/L (ref 44–121)
Bilirubin Total: 0.7 mg/dL (ref 0.0–1.2)
Bilirubin, Direct: 0.25 mg/dL (ref 0.00–0.40)
Total Protein: 6.3 g/dL (ref 6.0–8.5)

## 2023-11-09 ENCOUNTER — Other Ambulatory Visit: Payer: Self-pay | Admitting: *Deleted

## 2023-11-09 ENCOUNTER — Ambulatory Visit: Payer: Medicare Other | Attending: Cardiovascular Disease | Admitting: Cardiovascular Disease

## 2023-11-09 ENCOUNTER — Encounter: Payer: Self-pay | Admitting: Cardiovascular Disease

## 2023-11-09 VITALS — BP 118/54 | HR 52 | Ht 70.0 in | Wt 182.2 lb

## 2023-11-09 DIAGNOSIS — E782 Mixed hyperlipidemia: Secondary | ICD-10-CM

## 2023-11-09 DIAGNOSIS — I4819 Other persistent atrial fibrillation: Secondary | ICD-10-CM

## 2023-11-09 DIAGNOSIS — R931 Abnormal findings on diagnostic imaging of heart and coronary circulation: Secondary | ICD-10-CM | POA: Diagnosis not present

## 2023-11-09 DIAGNOSIS — I10 Essential (primary) hypertension: Secondary | ICD-10-CM

## 2023-11-09 DIAGNOSIS — R7989 Other specified abnormal findings of blood chemistry: Secondary | ICD-10-CM

## 2023-11-09 NOTE — Assessment & Plan Note (Signed)
 History of essential hypertension her blood pressure measured today at 118/54.  He is on amlodipine and metoprolol .

## 2023-11-09 NOTE — Assessment & Plan Note (Signed)
 History of PAF on Eliquis  oral anticoagulation currently in sinus rhythm.  He did have an event monitor performed 08/02/2023 revealing A-fib burden 14%, occasional PACs and PVCs.  He is not aware that he is in A-fib.

## 2023-11-09 NOTE — Patient Instructions (Signed)
 Medication Instructions:  Your physician recommends that you continue on your current medications as directed. Please refer to the Current Medication list given to you today.  *If you need a refill on your cardiac medications before your next appointment, please call your pharmacy*  Follow-Up: At [facility], you and your health needs are our priority.  As part of our continuing mission to provide you with exceptional heart care, our providers are all part of one team.  This team includes your primary Cardiologist (physician) and Advanced Practice Providers or APPs (Physician Assistants and Nurse Practitioners) who all work together to provide you with the care you need, when you need it.  Your next appointment:   6 month(s)  Provider:   Marcie Sever, PA-C, Liane Redman, PA-C, Hao Meng, PA-C, Marlana Silvan, NP, or Katlyn West, NP       Then, Lauro Portal, MD  will plan to see you again in 12 month(s).    We recommend signing up for the patient portal called "MyChart".  Sign up information is provided on this After Visit Summary.  MyChart is used to connect with patients for Virtual Visits (Telemedicine).  Patients are able to view lab/test results, encounter notes, upcoming appointments, etc.  Non-urgent messages can be sent to your provider as well.   To learn more about what you can do with MyChart, go to [email or web address].   Other Instructions       1st Floor: - Lobby - Registration  - Pharmacy  - Lab - Cafe  2nd Floor: - PV Lab - Diagnostic Testing (echo, CT, nuclear med)  3rd Floor: - Vacant  4th Floor: - TCTS (cardiothoracic surgery) - AFib Clinic - Structural Heart Clinic - Vascular Surgery  - Vascular Ultrasound  5th Floor: - HeartCare Cardiology (general and EP) - Clinical Pharmacy for coumadin, hypertension, lipid, weight-loss medications, and med management appointments    Valet parking services will be available as well.

## 2023-11-09 NOTE — Assessment & Plan Note (Signed)
 Elevated coronary calcium  score of 514 performed 06/22/23 by his PCP most of which was in the RCA.  Is completely asymptomatic.  He is at goal for secondary prevention with regard to his hyperlipidemia.

## 2023-11-09 NOTE — Progress Notes (Signed)
 11/09/2023 Manuel [name]   05-18-1941  [id number]  Primary Physician Bertha Broad, MD Primary Cardiologist: Avanell Leigh MD FACP, [place], [place], [place]  HPI:  Manuel [name] is a 83 y.o.   mild to moderately overweight recently widowed Caucasian male father of 2, grandfather of 5 grandchildren who is a retired Psychologist, occupational.  He was referred by Dr. Adan Holms, his PCP, for cardiovascular valuation because of a recent abnormal coronary calcium  score.  I last saw him in the office 08/02/2023.  His risk factors include hypertension hyperlipidemia.  He smoked back in the 60s.  There is no family history of heart disease.  Never had a heart attack or stroke.  He denies chest pain or shortness of breath.  He is fairly active and plays golf 3 days a week (carries his bag 18 holes).  He walks walks 40 minutes a day and works in his room.  He did have a coronary calcium  score performed 06/22/23 which was 514 the majority of which was in the RCA.  His EKG shows A-fib with RVR.  Unclear what the chronicity is.  He is however asymptomatic from this.  Since I saw him 3 months ago I did get a 2-week Zio patch 08/02/2023 that revealed A-fib with a burden of 14% otherwise occasional PACs and PVCs.  2D echo performed 08/24/2023 was entirely normal.  His lipid profile has improved on atorvastatin  now with an LDL of 68, at goal for secondary prevention given his elevated coronary calcium  score.  He is fairly active and asymptomatic.  He did fall out of his golf cart on 10/22/2023 and fell back and hit his head, he saw his PCP and has not had a head CT but is concerned about this.  He has no neurologic symptoms currently.   Current Meds  Medication Sig   amLODipine (NORVASC) 5 MG tablet Take 5 mg by mouth daily.   apixaban  (ELIQUIS ) 5 MG TABS tablet Take 1 tablet (5 mg total) by mouth 2 (two) times daily.   aspirin 81 MG tablet Take 81 mg by mouth daily.   atorvastatin  (LIPITOR) 40 MG tablet Take 1 tablet (40 mg total)  by mouth daily.   PREVIDENT 5000 BOOSTER PLUS 1.1 % PSTE Place onto teeth as directed.     No Known Allergies  Social History   Socioeconomic History   Marital status: Widowed    Spouse name: Not on file   Number of children: Not on file   Years of education: Not on file   Highest education level: Not on file  Occupational History   Not on file  Tobacco Use   Smoking status: Former    Current packs/day: 0.00    Average packs/day: 1 pack/day for 10.0 years (10.0 ttl pk-yrs)    Types: Cigarettes    Start date: 19    Quit date: 35    Years since quitting: 50.3   Smokeless tobacco: Never  Vaping Use   Vaping status: Never Used  Substance and Sexual Activity   Alcohol use: Yes    Alcohol/week: 3.0 standard drinks of alcohol    Types: 3 Glasses of wine per week   Drug use: No   Sexual activity: Not on file  Other Topics Concern   Not on file  Social History Narrative   Not on file   Social Drivers of Health   Financial Resource Strain: Not on file  Food Insecurity: Not on file  Transportation Needs:  Not on file  Physical Activity: Not on file  Stress: Not on file  Social Connections: Not on file  Intimate Partner Violence: Not on file     Review of Systems: General: negative for chills, fever, night sweats or weight changes.  Cardiovascular: negative for chest pain, dyspnea on exertion, edema, orthopnea, palpitations, paroxysmal nocturnal dyspnea or shortness of breath Dermatological: negative for rash Respiratory: negative for cough or wheezing Urologic: negative for hematuria Abdominal: negative for nausea, vomiting, diarrhea, bright red blood per rectum, melena, or hematemesis Neurologic: negative for visual changes, syncope, or dizziness All other systems reviewed and are otherwise negative except as noted above.    Blood pressure (!) 118/54, pulse (!) 52, height 5\' 10"  (1.778 m), weight 182 lb 3.2 oz (82.6 kg), SpO2 98%.  General appearance: alert and  no distress Neck: no adenopathy, no carotid bruit, no JVD, supple, symmetrical, trachea midline, and thyroid not enlarged, symmetric, no tenderness/mass/nodules Lungs: clear to auscultation bilaterally Heart: regular rate and rhythm, S1, S2 normal, no murmur, click, rub or gallop Extremities: extremities normal, atraumatic, no cyanosis or edema Pulses: 2+ and symmetric Skin: Skin color, texture, turgor normal. No rashes or lesions Neurologic: Grossly normal  EKG not performed today      ASSESSMENT AND PLAN:   Persistent atrial fibrillation (HCC) History of PAF on Eliquis  oral anticoagulation currently in sinus rhythm.  He did have an event monitor performed 08/02/2023 revealing A-fib burden 14%, occasional PACs and PVCs.  He is not aware that he is in A-fib.  Elevated coronary artery calcium  score Elevated coronary calcium  score of 514 performed 06/22/23 by his PCP most of which was in the RCA.  Is completely asymptomatic.  He is at goal for secondary prevention with regard to his hyperlipidemia.  Essential hypertension History of essential hypertension her blood pressure measured today at 118/54.  He is on amlodipine and metoprolol .  Hyperlipidemia History of hyperlipidemia on statin therapy with lipid profile performed/16/25 revealed a total cholesterol 112, LDL of 68 and HDL of 32, at goal for secondary prevention.     Avanell Leigh MD FACP,FACC,FAHA, [facility] 11/09/2023 9:25 AM

## 2023-11-09 NOTE — Assessment & Plan Note (Signed)
 History of hyperlipidemia on statin therapy with lipid profile performed/16/25 revealed a total cholesterol 112, LDL of 68 and HDL of 32, at goal for secondary prevention.

## 2023-11-18 ENCOUNTER — Encounter: Payer: Self-pay | Admitting: Cardiovascular Disease

## 2023-11-20 ENCOUNTER — Encounter: Payer: Self-pay | Admitting: Cardiovascular Disease

## 2024-01-13 ENCOUNTER — Encounter: Payer: Self-pay | Admitting: Cardiovascular Disease

## 2024-01-18 ENCOUNTER — Encounter: Payer: Self-pay | Admitting: Cardiovascular Disease

## 2024-02-07 ENCOUNTER — Encounter: Payer: Self-pay | Admitting: Cardiovascular Disease

## 2024-02-09 LAB — HEPATIC FUNCTION PANEL
ALT: 35 IU/L (ref 0–44)
AST: 25 IU/L (ref 0–40)
Albumin: 4.4 g/dL (ref 3.7–4.7)
Alkaline Phosphatase: 97 IU/L (ref 44–121)
Bilirubin Total: 0.9 mg/dL (ref 0.0–1.2)
Bilirubin, Direct: 0.32 mg/dL (ref 0.00–0.40)
Total Protein: 6.8 g/dL (ref 6.0–8.5)

## 2024-02-10 ENCOUNTER — Ambulatory Visit: Payer: Self-pay | Admitting: Cardiovascular Disease

## 2024-02-10 ENCOUNTER — Encounter: Payer: Self-pay | Admitting: Cardiovascular Disease

## 2024-04-16 ENCOUNTER — Encounter: Payer: Self-pay | Admitting: Cardiovascular Disease

## 2024-04-16 DIAGNOSIS — M79604 Pain in right leg: Secondary | ICD-10-CM

## 2024-04-25 ENCOUNTER — Ambulatory Visit (HOSPITAL_COMMUNITY)
Admission: RE | Admit: 2024-04-25 | Discharge: 2024-04-25 | Disposition: A | Discharge status: Home / Self Care | Source: Ambulatory Visit | Attending: Cardiovascular Disease | Admitting: Cardiovascular Disease

## 2024-04-25 ENCOUNTER — Ambulatory Visit (HOSPITAL_BASED_OUTPATIENT_CLINIC_OR_DEPARTMENT_OTHER)
Admission: RE | Admit: 2024-04-25 | Discharge: 2024-04-25 | Disposition: A | Discharge status: Home / Self Care | Source: Ambulatory Visit | Attending: Cardiovascular Disease | Admitting: Cardiovascular Disease

## 2024-04-25 DIAGNOSIS — M79604 Pain in right leg: Secondary | ICD-10-CM | POA: Diagnosis present

## 2024-04-25 DIAGNOSIS — M79605 Pain in left leg: Secondary | ICD-10-CM | POA: Insufficient documentation

## 2024-04-25 LAB — VAS US ABI WITH/WO TBI
Left ABI: 1.24
Right ABI: 1.3

## 2024-04-26 ENCOUNTER — Ambulatory Visit: Payer: Self-pay | Admitting: Cardiovascular Disease

## 2024-04-30 ENCOUNTER — Encounter: Payer: Self-pay | Admitting: Cardiovascular Disease

## 2024-04-30 DIAGNOSIS — I4819 Other persistent atrial fibrillation: Secondary | ICD-10-CM

## 2024-05-01 NOTE — Telephone Encounter (Signed)
 AFIB clinic would be perfect!!!  Court Dorn PARAS, MD to [facility] Triage    04/30/24  7:24 PM

## 2024-05-08 ENCOUNTER — Ambulatory Visit (HOSPITAL_COMMUNITY)
Admission: RE | Admit: 2024-05-08 | Discharge: 2024-05-08 | Disposition: A | Discharge status: Home / Self Care | Source: Ambulatory Visit | Attending: Internal Medicine | Admitting: Internal Medicine

## 2024-05-08 ENCOUNTER — Encounter (HOSPITAL_COMMUNITY): Payer: Self-pay | Admitting: Internal Medicine

## 2024-05-08 VITALS — BP 124/60 | HR 51 | Ht 70.0 in | Wt 181.0 lb

## 2024-05-08 DIAGNOSIS — I4819 Other persistent atrial fibrillation: Secondary | ICD-10-CM

## 2024-05-08 DIAGNOSIS — I480 Paroxysmal atrial fibrillation: Secondary | ICD-10-CM

## 2024-05-08 DIAGNOSIS — D6869 Other thrombophilia: Secondary | ICD-10-CM | POA: Diagnosis not present

## 2024-05-08 NOTE — Progress Notes (Signed)
 Primary Care Physician: Yolande Toribio MATSU, MD Primary Cardiologist: None Electrophysiologist: None     Referring Physician: Dr. Court Deward LITTIE Manuel [name] is a 83 y.o. male with a history of HTN, HLD, history of tobacco use, CAD, and atrial fibrillation who presents for consultation in the [facility]. Patient contacted office on 04/30/24 asking several questions about Afib. Patient is on Eliquis  for stroke prevention.  On evaluation today, patient is currently in NSR. He takes Toprol  50 mg daily. He does not have cardiac awareness of Afib. He did not have cardiac awareness during initial diagnosis or during monitor period. He notes to exercise several times a week and plays golf the rest of the week without issue.  Today, he denies symptoms of palpitations, chest pain, shortness of breath, orthopnea, PND, lower extremity edema, dizziness, presyncope, syncope, bleeding, or neurologic sequela. The patient is tolerating medications without difficulties and is otherwise without complaint today.   he has a BMI of Body mass index is 25.97 kg/m.SABRA Filed Weights   05/08/24 1423  Weight: 82.1 kg    Current Outpatient Medications  Medication Sig Dispense Refill   amLODipine (NORVASC) 5 MG tablet Take 5 mg by mouth daily.     apixaban  (ELIQUIS ) 5 MG TABS tablet Take 1 tablet (5 mg total) by mouth 2 (two) times daily. 180 tablet 3   aspirin 81 MG tablet Take 81 mg by mouth daily.     atorvastatin  (LIPITOR) 40 MG tablet Take 1 tablet (40 mg total) by mouth daily. 90 tablet 3   metoprolol  succinate (TOPROL -XL) 50 MG 24 hr tablet Take 1 tablet (50 mg total) by mouth daily. Take with or immediately following a meal. 90 tablet 3   PREVIDENT 5000 BOOSTER PLUS 1.1 % PSTE Place onto teeth as directed.     No current facility-administered medications for this encounter.    Atrial Fibrillation Management history:  Previous antiarrhythmic drugs: none Previous  cardioversions: none Previous ablations: none Anticoagulation history: Eliquis    ROS- All systems are reviewed and negative except as per the HPI above.  Physical Exam: BP 124/60   Pulse (!) 51   Ht 5' 10 (1.778 m)   Wt 82.1 kg   BMI 25.97 kg/m   GEN: Well nourished, well developed in no acute distress NECK: No JVD; No carotid bruits CARDIAC: Regular rate and rhythm, no murmurs, rubs, gallops RESPIRATORY:  Clear to auscultation without rales, wheezing or rhonchi  ABDOMEN: Soft, non-tender, non-distended EXTREMITIES:  No edema; No deformity   EKG today demonstrates  Vent. rate 51 BPM PR interval 174 ms QRS duration 74 ms QT/QTcB 454/418 ms P-R-T axes 56 41 -1 Sinus bradycardia Nonspecific ST abnormality Abnormal ECG When compared with ECG of 02-Aug-2023 09:18, Sinus rhythm has replaced Atrial fibrillation  Echo 08/24/23 demonstrated  1. Left ventricular ejection fraction, by estimation, is 55 to 60%. The  left ventricle has normal function. The left ventricle has no regional  wall motion abnormalities. Left ventricular diastolic parameters are  indeterminate.   2. Right ventricular systolic function is normal. The right ventricular  size is normal. There is normal pulmonary artery systolic pressure. The  estimated right ventricular systolic pressure is 29.8 mmHg.   3. The mitral valve is normal in structure. Trivial mitral valve  regurgitation. No evidence of mitral stenosis.   4. The aortic valve is tricuspid. There is mild calcification of the  aortic valve. Aortic valve regurgitation is mild.  5. The inferior vena cava is normal in size with greater than 50%  respiratory variability, suggesting right atrial pressure of 3 mmHg.   6. The patient was atrial fibrillation.   ASSESSMENT & PLAN CHA2DS2-VASc Score = 4  The patient's score is based upon: CHF History: 0 HTN History: 1 Diabetes History: 0 Stroke History: 0 Vascular Disease History: 1 Age Score:  2 Gender Score: 0       ASSESSMENT AND PLAN: Paroxysmal Atrial Fibrillation (ICD10:  I48.0) The patient's CHA2DS2-VASc score is 4, indicating a 4.8% annual risk of stroke.    Patient is currently in NSR. Education about Afib provided. Reassurance provided. After discussion, agree it is very reasonable to continue Toprol  and OAC without change in rate control strategy. Recommended he call clinic if notes any symptoms otherwise appears to be totally asymptomatic. Can consider Kardiamobile device to monitor at home otherwise exercise as tolerated.    Secondary Hypercoagulable State (ICD10:  D68.69) The patient is at significant risk for stroke/thromboembolism based upon his CHA2DS2-VASc Score of 4.  Continue Apixaban  (Eliquis ).  Continue Eliquis  5 mg BID.    Follow up Afib clinic prn.   Terra Pac, [facility]  Afib Clinic [address] [place], [place] [address] [phone]

## 2024-07-14 ENCOUNTER — Other Ambulatory Visit: Payer: Self-pay | Admitting: Cardiovascular Disease

## 2024-07-20 ENCOUNTER — Other Ambulatory Visit: Payer: Self-pay | Admitting: Cardiovascular Disease

## 2024-07-20 ENCOUNTER — Encounter: Payer: Self-pay | Admitting: Cardiovascular Disease

## 2024-07-20 NOTE — Telephone Encounter (Signed)
 Prescription refill request for Eliquis  received. Indication:  A-Fib Last office visit:   11/09/2023 Scr:   63.7  06/20/2024  [facility] Age:  84 yrs old Weight: 82.1 kg  Pt meet protocol medication sent to pt's pharmacy requesting pt make appt for April 2026 with Dr. Court.

## 2024-07-23 ENCOUNTER — Encounter: Payer: Self-pay | Admitting: Cardiovascular Disease

## 2024-07-25 ENCOUNTER — Other Ambulatory Visit: Payer: Self-pay

## 2024-07-25 ENCOUNTER — Emergency Department (HOSPITAL_BASED_OUTPATIENT_CLINIC_OR_DEPARTMENT_OTHER): Admitting: Radiology

## 2024-07-25 ENCOUNTER — Encounter: Payer: Self-pay | Admitting: Physician Assistant

## 2024-07-25 ENCOUNTER — Ambulatory Visit: Attending: Physician Assistant | Admitting: Physician Assistant

## 2024-07-25 ENCOUNTER — Emergency Department (HOSPITAL_BASED_OUTPATIENT_CLINIC_OR_DEPARTMENT_OTHER)
Admission: EM | Admit: 2024-07-25 | Discharge: 2024-07-26 | Disposition: A | Discharge status: Home / Self Care | Attending: Emergency Medicine | Admitting: Emergency Medicine

## 2024-07-25 ENCOUNTER — Encounter (HOSPITAL_BASED_OUTPATIENT_CLINIC_OR_DEPARTMENT_OTHER): Payer: Self-pay | Admitting: Emergency Medicine

## 2024-07-25 VITALS — BP 150/68 | HR 51 | Ht 70.0 in | Wt 184.6 lb

## 2024-07-25 DIAGNOSIS — I480 Paroxysmal atrial fibrillation: Secondary | ICD-10-CM | POA: Insufficient documentation

## 2024-07-25 DIAGNOSIS — R739 Hyperglycemia, unspecified: Secondary | ICD-10-CM | POA: Insufficient documentation

## 2024-07-25 DIAGNOSIS — Z7982 Long term (current) use of aspirin: Secondary | ICD-10-CM | POA: Insufficient documentation

## 2024-07-25 DIAGNOSIS — I4819 Other persistent atrial fibrillation: Secondary | ICD-10-CM

## 2024-07-25 DIAGNOSIS — R931 Abnormal findings on diagnostic imaging of heart and coronary circulation: Secondary | ICD-10-CM

## 2024-07-25 DIAGNOSIS — Z79899 Other long term (current) drug therapy: Secondary | ICD-10-CM | POA: Diagnosis not present

## 2024-07-25 DIAGNOSIS — I10 Essential (primary) hypertension: Secondary | ICD-10-CM

## 2024-07-25 DIAGNOSIS — E782 Mixed hyperlipidemia: Secondary | ICD-10-CM | POA: Diagnosis not present

## 2024-07-25 DIAGNOSIS — R9431 Abnormal electrocardiogram [ECG] [EKG]: Secondary | ICD-10-CM | POA: Diagnosis not present

## 2024-07-25 DIAGNOSIS — Z7901 Long term (current) use of anticoagulants: Secondary | ICD-10-CM | POA: Diagnosis not present

## 2024-07-25 DIAGNOSIS — I4891 Unspecified atrial fibrillation: Secondary | ICD-10-CM

## 2024-07-25 LAB — BASIC METABOLIC PANEL WITH GFR
Anion gap: 13 (ref 5–15)
BUN: 14 mg/dL (ref 8–23)
CO2: 24 mmol/L (ref 22–32)
Calcium: 9.7 mg/dL (ref 8.9–10.3)
Chloride: 104 mmol/L (ref 98–111)
Creatinine, Ser: 0.74 mg/dL (ref 0.61–1.24)
GFR, Estimated: >60 mL/min
Glucose, Bld: 157 mg/dL — ABNORMAL HIGH (ref 70–99)
Potassium: 3.7 mmol/L (ref 3.5–5.1)
Sodium: 140 mmol/L (ref 135–145)

## 2024-07-25 LAB — CBC
HCT: 44.2 % (ref 39.0–52.0)
Hemoglobin: 15.2 g/dL (ref 13.0–17.0)
MCH: 31.7 pg (ref 26.0–34.0)
MCHC: 34.4 g/dL (ref 30.0–36.0)
MCV: 92.3 fL (ref 80.0–100.0)
Platelets: 190 K/uL (ref 150–400)
RBC: 4.79 MIL/uL (ref 4.22–5.81)
RDW: 13.4 % (ref 11.5–15.5)
WBC: 8.1 K/uL (ref 4.0–10.5)
nRBC: 0 % (ref 0.0–0.2)

## 2024-07-25 LAB — TROPONIN T, HIGH SENSITIVITY: Troponin T High Sensitivity: <15 ng/L (ref 0–19)

## 2024-07-25 LAB — PROTIME-INR
INR: 1 (ref 0.8–1.2)
Prothrombin Time: 13.6 s (ref 11.4–15.2)

## 2024-07-25 NOTE — Progress Notes (Signed)
 " Cardiology Office Note   Date:  07/25/2024  ID:  Manuel [name], DOB 1941/02/03, MRN [id number] PCP: Yolande Toribio MATSU, MD  [facility] Providers Cardiologist:  Dorn Lesches, MD     History of Present Illness Manuel [name] is a 84 y.o. male with PMH of elevated coronary calcium  score, persistent atrial fibrillation, hypertension and hyperlipidemia.  He smoked back in the 60s.  He there is no family history of heart disease.  Patient himself has never had a heart attack or stroke.  After his retirement, he continued to play golf 3 times a week.  Calcium  score test performed on 12//2024 showed calcium  score of 514, majority of which was in the RCA.  EKG showed A-fib with RVR of unknown chronicity.  He was asymptomatic with this.  Subsequent heart monitor performed in January 2024 showed A-fib burden of 14% with occasional PAC and PVCs.  2D echo obtained in February 2025 was normal.  He is on Eliquis  and metoprolol  succinate.  ABI obtained in October 2025 was normal was right ABI 1.3, left ABI 1.24.  Arterial ultrasound of the lower extremity showed no significant disease, three-vessel runoff bilaterally.  He was last seen by A-fib clinic on 05/08/2024 at which time he was in sinus rhythm.  Patient presents today for 68-month follow-up.  He has been doing well without any chest pain or shortness of breath.  He continued to play golf 3 times a week.  He also goes to the gym in between to exercise.  He can easily exercise for 15 minutes without any exertional symptom.  He is EKG however showed significant T wave inversion in the inferior lead was J-point elevation in aVL but not in any adjacent lead.  This is unchanged when compared with the previous EKG in October.  I reviewed his EKG with Dr. Lesches who recommended coronary CT to make sure patient does not have severe underlying coronary artery disease.  He does not have any claudication symptom.  He appears to be euvolemic on exam and has no lower  extremity edema, orthopnea or PND.  Blood pressure is high in the office, even on manual recheck by myself, blood pressure was 150/68, however his blood pressure has been normal at home.  Asked him to continue to monitor his blood pressure, if systolic blood pressures persistently over 140 mmHg, we can consider increased amlodipine dosage.  Otherwise, he can follow-up with Dr. Lesches in 6 months.  The cost of Eliquis  for first 3 months supply was over $400, however he suspect he has not met the deductible yet.  If the cost remain high, may need to consider alternative agent.  ROS:   He denies chest pain, palpitations, dyspnea, pnd, orthopnea, n, v, dizziness, syncope, edema, weight gain, or early satiety. All other systems reviewed and are otherwise negative except as noted above.    Studies Reviewed      Cardiac Studies & Procedures   ______________________________________________________________________________________________     ECHOCARDIOGRAM  ECHOCARDIOGRAM COMPLETE 08/24/2023  Narrative ECHOCARDIOGRAM REPORT    Patient Name:   Manuel [name] Date of Exam: 08/24/2023 Medical Rec #:  [id number]      Height:       70.0 in Accession #:    [id number]     Weight:       181.0 lb Date of Birth:  02/15/1941       BSA:          2.001 m Patient Age:  82 years       BP:           148/72 mmHg Patient Gender: M              HR:           75 bpm. Exam Location:  [address]  Procedure: 2D Echo, 3D Echo, Cardiac Doppler and Color Doppler  Indications:    Atrial fibrillation (HCC) [427.31.ICD-9-CM] Elevated coronary artery calcium  score [8658748] Essential hypertension [257793]  History:        Patient has no prior history of Echocardiogram examinations. Arrythmias:Atrial Fibrillation; Risk Factors:Dyslipidemia and Hypertension.  Sonographer:    Lauraine Pilot RDCS Referring Phys: [phone] JONATHAN J BERRY  IMPRESSIONS   1. Left ventricular ejection fraction, by estimation, is 55 to  60%. The left ventricle has normal function. The left ventricle has no regional wall motion abnormalities. Left ventricular diastolic parameters are indeterminate. 2. Right ventricular systolic function is normal. The right ventricular size is normal. There is normal pulmonary artery systolic pressure. The estimated right ventricular systolic pressure is 29.8 mmHg. 3. The mitral valve is normal in structure. Trivial mitral valve regurgitation. No evidence of mitral stenosis. 4. The aortic valve is tricuspid. There is mild calcification of the aortic valve. Aortic valve regurgitation is mild. 5. The inferior vena cava is normal in size with greater than 50% respiratory variability, suggesting right atrial pressure of 3 mmHg. 6. The patient was atrial fibrillation.  FINDINGS Left Ventricle: Left ventricular ejection fraction, by estimation, is 55 to 60%. The left ventricle has normal function. The left ventricle has no regional wall motion abnormalities. The left ventricular internal cavity size was normal in size. There is no left ventricular hypertrophy. Left ventricular diastolic parameters are indeterminate.  Right Ventricle: The right ventricular size is normal. No increase in right ventricular wall thickness. Right ventricular systolic function is normal. There is normal pulmonary artery systolic pressure. The tricuspid regurgitant velocity is 2.59 m/s, and with an assumed right atrial pressure of 3 mmHg, the estimated right ventricular systolic pressure is 29.8 mmHg.  Left Atrium: Left atrial size was normal in size.  Right Atrium: Right atrial size was normal in size.  Pericardium: There is no evidence of pericardial effusion.  Mitral Valve: The mitral valve is normal in structure. Mild mitral annular calcification. Trivial mitral valve regurgitation. No evidence of mitral valve stenosis.  Tricuspid Valve: The tricuspid valve is normal in structure. Tricuspid valve regurgitation is  trivial.  Aortic Valve: The aortic valve is tricuspid. There is mild calcification of the aortic valve. Aortic valve regurgitation is mild.  Pulmonic Valve: The pulmonic valve was normal in structure. Pulmonic valve regurgitation is trivial.  Aorta: The aortic root is normal in size and structure.  Venous: The inferior vena cava is normal in size with greater than 50% respiratory variability, suggesting right atrial pressure of 3 mmHg.  IAS/Shunts: No atrial level shunt detected by color flow Doppler.   LEFT VENTRICLE PLAX 2D LVIDd:         4.09 cm LVIDs:         2.75 cm LV PW:         1.06 cm LV IVS:        1.07 cm LVOT diam:     2.04 cm   3D Volume EF: LV SV:         63        3D EF:        52 %  LV SV Index:   32        LV EDV:       123 ml LVOT Area:     3.27 cm  LV ESV:       60 ml LV SV:        63 ml  RIGHT VENTRICLE RV S prime:     13.20 cm/s TAPSE (M-mode): 2.3 cm  LEFT ATRIUM             Index        RIGHT ATRIUM           Index LA diam:        4.38 cm 2.19 cm/m   RA Area:     15.40 cm LA Vol (A2C):   52.8 ml 26.39 ml/m  RA Volume:   33.10 ml  16.54 ml/m LA Vol (A4C):   58.5 ml 29.24 ml/m LA Biplane Vol: 59.1 ml 29.54 ml/m AORTIC VALVE LVOT Vmax:   107.00 cm/s LVOT Vmean:  68.300 cm/s LVOT VTI:    0.194 m  AORTA Ao Root diam: 3.33 cm Ao Asc diam:  3.62 cm  TRICUSPID VALVE TR Peak grad:   26.8 mmHg TR Vmax:        259.00 cm/s  SHUNTS Systemic VTI:  0.19 m Systemic Diam: 2.04 cm  Dalton McleanMD Electronically signed by Ezra Kanner Signature Date/Time: 08/24/2023/8:59:58 AM    Final    MONITORS  LONG TERM MONITOR (3-14 DAYS) 08/26/2023  Narrative Patch Wear Time:  13 days and 20 hours (2025-01-18T09:18:16-0500 to 2025-02-01T06:13:33-0500)  Patient had a min HR of 39 bpm, max HR of 128 bpm, and avg HR of 56 bpm. Predominant underlying rhythm was Sinus Rhythm. Atrial Fibrillation occurred (14% burden), ranging from 51-128 bpm (avg of 78  bpm), the longest lasting 1 day 1 hour with an avg rate of 79 bpm. Atrial Fibrillation was detected within +/- 45 seconds of symptomatic patient event(s). Isolated SVEs were rare (<1.0%), SVE Couplets were rare (<1.0%), and SVE Triplets were rare (<1.0%). Isolated VEs were rare (<1.0%), VE Couplets were rare (<1.0%), and no VE Triplets were present.  SR/SB/ST Occasional PACs/PVCs Afib with variable ventricular response. 14% burden. Needs OV follow up with APP next week if not already on DOAC       ______________________________________________________________________________________________      Risk Assessment/Calculations   HYPERTENSION CONTROL Vitals:   07/25/24 0814 07/25/24 0922  BP: (!) 150/66 (!) 150/68    The patient's blood pressure is elevated above target today.  In order to address the patient's elevated BP: Blood pressure will be monitored at home to determine if medication changes need to be made.          Physical Exam VS:  BP (!) 150/68   Pulse (!) 51   Ht 5' 10 (1.778 m)   Wt 184 lb 9.6 oz (83.7 kg)   SpO2 96%   BMI 26.49 kg/m        Wt Readings from Last 3 Encounters:  07/25/24 184 lb 9.6 oz (83.7 kg)  05/08/24 181 lb (82.1 kg)  11/09/23 182 lb 3.2 oz (82.6 kg)    GEN: Well nourished, well developed in no acute distress NECK: No JVD; No carotid bruits CARDIAC: RRR, no murmurs, rubs, gallops RESPIRATORY:  Clear to auscultation without rales, wheezing or rhonchi  ABDOMEN: Soft, non-tender, non-distended EXTREMITIES:  No edema; No deformity   ASSESSMENT AND PLAN  Persistent atrial fibrillation  - On Eliquis  and metoprolol   succinate, currently maintaining sinus bradycardia.  Previous heart monitor in February 2025 showed A-fib burden of 14%.  - Patient has no cardiac awareness of atrial fibrillation.  Elevated calcium  score/abnormal EKG  - Calcium  score of over 400, majority of which in right coronary artery.  - EKG showed T wave inversion in  the inferior lead.  - Case reviewed with Dr. Court, we will proceed with coronary CTA to make sure he does not have significant coronary artery disease.  Hypertension - Blood pressure remained elevated in the office, however per patient report, blood pressure is usually very well-controlled at home.  We decided to hold off on adjusting blood pressure medication. - Continue to monitor blood pressure at home, if systolic blood pressure is persistently over 140 mmHg, we will consider increase amlodipine dosage.  Hyperlipidemia: On atorvastatin        Dispo: Follow-up with Dr. Court in 6 months, earlier if coronary CT come back abnormal.  Signed, Scot Ford, PA  "

## 2024-07-25 NOTE — Patient Instructions (Signed)
 Medication Instructions:  NO CHANGES *If you need a refill on your cardiac medications before your next appointment, please call your pharmacy*  Lab Work: NO LABS If you have labs (blood work) drawn today and your tests are completely normal, you will receive your results only by: MyChart Message (if you have MyChart) OR A paper copy in the mail If you have any lab test that is abnormal or we need to change your treatment, we will call you to review the results.  Testing/Procedures:   Your cardiac CT will be scheduled at one of the below locations:   [facility] [address] [place], [place] [address] [phone] (Severe contrast allergies only)    [facility] [address]  [place], [place] [address]   If scheduled at [facility], please arrive at the [facility] and Children's Entrance (Entrance C2) of [facility] 30 minutes prior to test start time. You can use the FREE valet parking offered at entrance C (encouraged to control the heart rate for the test)  Proceed to the [facility] Radiology Department (first floor) to check-in and test prep.  All radiology patients and guests should use entrance C2 at [facility], accessed from [facility], even though the hospital's physical address listed is [address]  If scheduled at the Heart and Vascular Tower at [address], please enter the parking lot using the [address] entrance and use the FREE valet service at the patient drop-off area. Enter the building and check-in with registration on the main floor.   Please follow these instructions carefully (unless otherwise directed):  An IV will be required for this test and Nitroglycerin will be given.  Hold all erectile dysfunction medications at least 3 days (72 hrs) prior to test. (Ie viagra, cialis, sildenafil, tadalafil, etc)   On the Night Before the Test: Be sure to Drink  plenty of water. Do not consume any caffeinated/decaffeinated beverages or chocolate 12 hours prior to your test. Do not take any antihistamines 12 hours prior to your test.  On the Day of the Test: Drink plenty of water until 1 hour prior to the test. Do not eat any food 1 hour prior to test. You may take your regular medications prior to the test.  Take metoprolol  (Lopressor ) two hours prior to test. If you take Furosemide/Hydrochlorothiazide/Spironolactone/Chlorthalidone, please HOLD on the morning of the test. Patients who wear a continuous glucose monitor MUST remove the device prior to scanning. FEMALES- please wear underwire-free bra if available, avoid dresses & tight clothing        After the Test: Drink plenty of water. After receiving IV contrast, you may experience a mild flushed feeling. This is normal. On occasion, you may experience a mild rash up to 24 hours after the test. This is not dangerous. If this occurs, you can take Benadryl 25 mg, Zyrtec, Claritin, or Allegra and increase your fluid intake. (Patients taking Tikosyn should avoid Benadryl, and may take Zyrtec, Claritin, or Allegra) If you experience trouble breathing, this can be serious. If it is severe call 911 IMMEDIATELY. If it is mild, please call our office.  We will call to schedule your test 2-4 weeks out understanding that some insurance companies will need an authorization prior to the service being performed.   For more information and frequently asked questions, please visit our website : [email or web address]  For non-scheduling related questions, please contact the cardiac imaging nurse  navigator should you have any questions/concerns: Cardiac Imaging Nurse Navigators Direct Office Dial: [phone]   For scheduling needs, including cancellations and rescheduling, please call Brittany, [phone].   Follow-Up: At [facility], you and your health needs are our priority.  As  part of our continuing mission to provide you with exceptional heart care, our providers are all part of one team.  This team includes your primary Cardiologist (physician) and Advanced Practice Providers or APPs (Physician Assistants and Nurse Practitioners) who all work together to provide you with the care you need, when you need it.  Your next appointment:   6 month(s)  Provider:   Dorn Lesches, MD  Other Instructions CHECK BLOOD PRESSURE DAILY IF SYSTOLIC IS PERSISTENTLY GRESTER THAN 140, GIVE OFFICE A CALL

## 2024-07-25 NOTE — ED Triage Notes (Signed)
 Pt via pov from home with hypertension this evening. Pt states he was seen for 6 month checkup at heart care this morning and was told everything was ok, but that he needs an expanded calcium  test. He states he felt anxious this evening and took his bp. Pt a&o x 4; nad noted.

## 2024-07-26 NOTE — ED Provider Notes (Signed)
 " [place] EMERGENCY DEPARTMENT AT [facility] Provider Note   CSN: [id number] Arrival date & time: 07/25/24  2000     Patient presents with: Hypertension   Manuel [name] is a 84 y.o. male.   40 male who presents here for hypertension.  Also current concern for possible atrial fibrillation.  Patient has known hypertension and paroxysmal atrial fibrillation.  States he is asymptomatic with his atrial fibrillation.  He states he checked his blood pressure earlier today as he had a low bit of pressure in his head and it was elevated and his heart rate was also elevated concerning for A-fib like he is had in the past.  He is on Eliquis  and metoprolol  at home.  Patient states that he feels better after being in the ER for few hours.  No nausea, vomiting, dyspnea, lightheadedness or other associated symptoms.  No recent illnesses.  Takes his medications as prescribed.  Denies alcohol, drugs, tobacco or new OTC medications.   Hypertension       Prior to Admission medications  Medication Sig Start Date End Date Taking? Authorizing Provider  amLODipine (NORVASC) 5 MG tablet Take 5 mg by mouth daily. 03/21/20   [provider]  apixaban  (ELIQUIS ) 5 MG TABS tablet Take 1 tablet (5 mg total) by mouth 2 (two) times daily. 07/20/24   Court Dorn PARAS, MD  aspirin 81 MG tablet Take 81 mg by mouth daily.    [provider]  atorvastatin  (LIPITOR) 40 MG tablet TAKE 1 TABLET(40 MG) BY MOUTH DAILY 07/20/24   Court Dorn PARAS, MD  metoprolol  succinate (TOPROL -XL) 50 MG 24 hr tablet TAKE 1 TABLET(50 MG) BY MOUTH DAILY WITH OR IMMEDIATELY FOLLOWING A MEAL 07/16/24   Court Dorn PARAS, MD  PREVIDENT 5000 BOOSTER PLUS 1.1 % PSTE Place onto teeth as directed. 01/21/20   [provider]    Allergies: Patient has no known allergies.    Review of Systems  Updated Vital Signs BP (!) 154/70   Pulse (!) 38   Temp 98 F (36.7 C) (Oral)   Resp 18   Ht 5' 10 (1.778 m)   Wt  83.7 kg   SpO2 98%   BMI 26.48 kg/m   Physical Exam Vitals and nursing note reviewed.  Constitutional:      Appearance: He is well-developed.  HENT:     Head: Normocephalic and atraumatic.  Eyes:     Pupils: Pupils are equal, round, and reactive to light.  Cardiovascular:     Rate and Rhythm: Normal rate. Rhythm irregular.  Pulmonary:     Effort: Pulmonary effort is normal. No respiratory distress.  Abdominal:     General: There is no distension.  Musculoskeletal:        General: Normal range of motion.     Cervical back: Normal range of motion.  Skin:    General: Skin is warm and dry.  Neurological:     General: No focal deficit present.     Mental Status: He is alert.     (all labs ordered are listed, but only abnormal results are displayed) Labs Reviewed  BASIC METABOLIC PANEL WITH GFR - Abnormal; Notable for the following components:      Result Value   Glucose, Bld 157 (*)    All other components within normal limits  CBC  PROTIME-INR  TROPONIN T, HIGH SENSITIVITY    EKG: None  Radiology: DG Chest 2 View Result Date: 07/25/2024 CLINICAL DATA:  Hypertension EXAM:  CHEST - 2 VIEW COMPARISON:  03/26/2024 FINDINGS: The heart size and mediastinal contours are within normal limits. Both lungs are clear. Degenerative changes of the spine. Aortic atherosclerosis. Mild scarring or atelectasis at the lingula IMPRESSION: No active cardiopulmonary disease. Mild scarring or atelectasis at the lingula. Electronically Signed   By: Luke Bun M.D.   On: 07/25/2024 20:45     Procedures   Medications Ordered in the ED - No data to display                                  Medical Decision Making Amount and/or Complexity of Data Reviewed Labs: ordered. Radiology: ordered.   Patient is in atrial fibrillation but is on appropriate treatment for same including Eliquis .  Slightly hypertensive here but nothing that would want me to adjust his medications acutely.  His  electrolytes are reassuring.  No indication for further workup, hospitalization at this time.  Will follow-up with his cardiologist.   Final diagnoses:  Hypertension, unspecified type  Hyperglycemia  Atrial fibrillation, unspecified type [facility])    ED Discharge Orders     None          Arianna Haydon, Selinda, MD 07/26/24 [phone]  "

## 2024-07-26 NOTE — ED Notes (Signed)
 ED Provider at bedside.

## 2024-07-26 NOTE — ED Notes (Signed)
 Reviewed discharge instructions and follow-up care with pt. Pt verbalized understanding and had no further questions. Pt exited ED without complications.

## 2024-07-30 ENCOUNTER — Encounter: Payer: Self-pay | Admitting: Cardiovascular Disease

## 2024-08-06 ENCOUNTER — Encounter (HOSPITAL_COMMUNITY): Payer: Self-pay

## 2024-08-08 ENCOUNTER — Ambulatory Visit (HOSPITAL_COMMUNITY)
Admission: RE | Admit: 2024-08-08 | Discharge: 2024-08-08 | Disposition: A | Discharge status: Home / Self Care | Source: Ambulatory Visit | Attending: Cardiology | Admitting: Cardiology

## 2024-08-08 ENCOUNTER — Ambulatory Visit: Payer: Self-pay | Admitting: Physician Assistant

## 2024-08-08 DIAGNOSIS — R9431 Abnormal electrocardiogram [ECG] [EKG]: Secondary | ICD-10-CM | POA: Diagnosis not present

## 2024-08-08 MED ORDER — NITROGLYCERIN 0.4 MG SL SUBL
0.8000 mg | SUBLINGUAL_TABLET | Freq: Once | SUBLINGUAL | Status: AC
  Administered 2024-08-08: 0.8 mg via SUBLINGUAL

## 2024-08-08 MED ORDER — IOHEXOL 350 MG/ML SOLN
100.0000 mL | Freq: Once | INTRAVENOUS | Status: AC | PRN
  Administered 2024-08-08: 100 mL via INTRAVENOUS
